# Patient Record
Sex: Male | Born: 1970 | Race: Black or African American | Hispanic: No | Marital: Single | Smoking: Never smoker
Health system: Southern US, Community
[De-identification: ages and names within clinical notes are randomized; demographics above are authoritative.]

## PROBLEM LIST (undated history)

## (undated) DIAGNOSIS — E119 Type 2 diabetes mellitus without complications: Secondary | ICD-10-CM

## (undated) DIAGNOSIS — E669 Obesity, unspecified: Secondary | ICD-10-CM

## (undated) DIAGNOSIS — I10 Essential (primary) hypertension: Secondary | ICD-10-CM

## (undated) DIAGNOSIS — E785 Hyperlipidemia, unspecified: Secondary | ICD-10-CM

## (undated) HISTORY — DX: Obesity, unspecified: E66.9

## (undated) HISTORY — DX: Hyperlipidemia, unspecified: E78.5

## (undated) HISTORY — DX: Type 2 diabetes mellitus without complications: E11.9

## (undated) HISTORY — DX: Essential (primary) hypertension: I10

---

## 1998-12-12 HISTORY — PX: CHOLECYSTECTOMY: SHX55

## 1999-09-21 ENCOUNTER — Emergency Department (HOSPITAL_COMMUNITY): Admission: EM | Admit: 1999-09-21 | Discharge: 1999-09-21 | Payer: Self-pay | Admitting: *Deleted

## 1999-09-21 ENCOUNTER — Encounter: Payer: Self-pay | Admitting: *Deleted

## 1999-10-10 ENCOUNTER — Emergency Department (HOSPITAL_COMMUNITY): Admission: EM | Admit: 1999-10-10 | Discharge: 1999-10-11 | Payer: Self-pay | Admitting: *Deleted

## 1999-10-19 ENCOUNTER — Observation Stay (HOSPITAL_COMMUNITY): Admission: RE | Admit: 1999-10-19 | Discharge: 1999-10-20 | Payer: Self-pay | Admitting: General Surgery

## 1999-10-19 ENCOUNTER — Encounter: Payer: Self-pay | Admitting: General Surgery

## 1999-10-19 ENCOUNTER — Encounter (INDEPENDENT_AMBULATORY_CARE_PROVIDER_SITE_OTHER): Payer: Self-pay

## 2001-07-30 ENCOUNTER — Emergency Department (HOSPITAL_COMMUNITY): Admission: EM | Admit: 2001-07-30 | Discharge: 2001-07-30 | Payer: Self-pay | Admitting: Podiatry

## 2002-02-09 ENCOUNTER — Emergency Department (HOSPITAL_COMMUNITY): Admission: EM | Admit: 2002-02-09 | Discharge: 2002-02-09 | Payer: Self-pay | Admitting: Emergency Medicine

## 2003-07-31 ENCOUNTER — Encounter: Payer: Self-pay | Admitting: Emergency Medicine

## 2003-07-31 ENCOUNTER — Emergency Department (HOSPITAL_COMMUNITY): Admission: EM | Admit: 2003-07-31 | Discharge: 2003-07-31 | Payer: Self-pay | Admitting: Emergency Medicine

## 2004-12-11 ENCOUNTER — Emergency Department (HOSPITAL_COMMUNITY): Admission: EM | Admit: 2004-12-11 | Discharge: 2004-12-11 | Payer: Self-pay | Admitting: Emergency Medicine

## 2005-01-26 ENCOUNTER — Ambulatory Visit: Payer: Self-pay | Admitting: Internal Medicine

## 2005-02-01 ENCOUNTER — Ambulatory Visit: Payer: Self-pay | Admitting: Internal Medicine

## 2005-02-02 ENCOUNTER — Ambulatory Visit: Payer: Self-pay | Admitting: *Deleted

## 2005-02-18 ENCOUNTER — Ambulatory Visit: Payer: Self-pay | Admitting: Internal Medicine

## 2005-06-03 ENCOUNTER — Ambulatory Visit: Payer: Self-pay | Admitting: Internal Medicine

## 2006-02-08 ENCOUNTER — Ambulatory Visit: Payer: Self-pay | Admitting: Internal Medicine

## 2006-08-03 ENCOUNTER — Ambulatory Visit: Payer: Self-pay | Admitting: Internal Medicine

## 2006-11-13 ENCOUNTER — Ambulatory Visit: Payer: Self-pay | Admitting: Internal Medicine

## 2006-11-14 ENCOUNTER — Encounter (INDEPENDENT_AMBULATORY_CARE_PROVIDER_SITE_OTHER): Payer: Self-pay | Admitting: Internal Medicine

## 2006-11-14 LAB — CONVERTED CEMR LAB
Hgb A1c MFr Bld: 8.7 %
TSH: 2.8 microintl units/mL

## 2006-11-30 ENCOUNTER — Ambulatory Visit: Payer: Self-pay | Admitting: Internal Medicine

## 2006-12-11 ENCOUNTER — Ambulatory Visit: Payer: Self-pay | Admitting: Internal Medicine

## 2007-03-02 ENCOUNTER — Ambulatory Visit: Payer: Self-pay | Admitting: Internal Medicine

## 2007-08-29 ENCOUNTER — Encounter (INDEPENDENT_AMBULATORY_CARE_PROVIDER_SITE_OTHER): Payer: Self-pay | Admitting: *Deleted

## 2007-09-04 ENCOUNTER — Encounter (INDEPENDENT_AMBULATORY_CARE_PROVIDER_SITE_OTHER): Payer: Self-pay | Admitting: Internal Medicine

## 2007-09-04 DIAGNOSIS — R809 Proteinuria, unspecified: Secondary | ICD-10-CM | POA: Insufficient documentation

## 2007-09-04 DIAGNOSIS — M25519 Pain in unspecified shoulder: Secondary | ICD-10-CM | POA: Insufficient documentation

## 2007-09-04 DIAGNOSIS — E1165 Type 2 diabetes mellitus with hyperglycemia: Secondary | ICD-10-CM | POA: Insufficient documentation

## 2007-09-04 DIAGNOSIS — E118 Type 2 diabetes mellitus with unspecified complications: Secondary | ICD-10-CM | POA: Insufficient documentation

## 2007-09-04 DIAGNOSIS — E119 Type 2 diabetes mellitus without complications: Secondary | ICD-10-CM

## 2008-02-21 ENCOUNTER — Ambulatory Visit: Payer: Self-pay | Admitting: Nurse Practitioner

## 2008-02-21 DIAGNOSIS — E785 Hyperlipidemia, unspecified: Secondary | ICD-10-CM

## 2008-02-21 DIAGNOSIS — I10 Essential (primary) hypertension: Secondary | ICD-10-CM

## 2008-02-21 LAB — CONVERTED CEMR LAB
Blood Glucose, Fingerstick: 150
Hgb A1c MFr Bld: 8 %

## 2008-02-26 LAB — CONVERTED CEMR LAB
ALT: 40 units/L (ref 0–53)
AST: 27 units/L (ref 0–37)
Albumin: 5.1 g/dL (ref 3.5–5.2)
Alkaline Phosphatase: 49 units/L (ref 39–117)
BUN: 12 mg/dL (ref 6–23)
CO2: 20 meq/L (ref 19–32)
Calcium: 10.1 mg/dL (ref 8.4–10.5)
Chloride: 101 meq/L (ref 96–112)
Cholesterol: 152 mg/dL (ref 0–200)
Creatinine, Ser: 0.92 mg/dL (ref 0.40–1.50)
Glucose, Bld: 157 mg/dL — ABNORMAL HIGH (ref 70–99)
HDL: 23 mg/dL — ABNORMAL LOW (ref >39)
LDL Cholesterol: 73 mg/dL (ref 0–99)
Microalb, Ur: 12.1 mg/dL — ABNORMAL HIGH (ref 0.00–1.89)
Potassium: 4.3 meq/L (ref 3.5–5.3)
Sodium: 138 meq/L (ref 135–145)
TSH: 1.502 microintl units/mL (ref 0.350–5.50)
Total Bilirubin: 0.5 mg/dL (ref 0.3–1.2)
Total CHOL/HDL Ratio: 6.6
Total Protein: 8.4 g/dL — ABNORMAL HIGH (ref 6.0–8.3)
Triglycerides: 282 mg/dL — ABNORMAL HIGH (ref <150)
VLDL: 56 mg/dL — ABNORMAL HIGH (ref 0–40)

## 2008-12-18 ENCOUNTER — Encounter (INDEPENDENT_AMBULATORY_CARE_PROVIDER_SITE_OTHER): Payer: Self-pay | Admitting: Nurse Practitioner

## 2008-12-25 ENCOUNTER — Telehealth (INDEPENDENT_AMBULATORY_CARE_PROVIDER_SITE_OTHER): Payer: Self-pay | Admitting: Nurse Practitioner

## 2009-01-07 ENCOUNTER — Ambulatory Visit: Payer: Self-pay | Admitting: Nurse Practitioner

## 2009-01-07 LAB — CONVERTED CEMR LAB
ALT: 39 units/L (ref 0–53)
AST: 20 units/L (ref 0–37)
Albumin: 5 g/dL (ref 3.5–5.2)
Alkaline Phosphatase: 53 units/L (ref 39–117)
BUN: 14 mg/dL (ref 6–23)
Basophils Absolute: 0 10*3/uL (ref 0.0–0.1)
Basophils Relative: 0 % (ref 0–1)
Bilirubin Urine: NEGATIVE
Blood Glucose, Fingerstick: 128
CO2: 24 meq/L (ref 19–32)
Calcium: 10.3 mg/dL (ref 8.4–10.5)
Chloride: 102 meq/L (ref 96–112)
Cholesterol, target level: 200 mg/dL
Creatinine, Ser: 0.87 mg/dL (ref 0.40–1.50)
Eosinophils Absolute: 0.2 10*3/uL (ref 0.0–0.7)
Eosinophils Relative: 2 % (ref 0–5)
Glucose, Bld: 113 mg/dL — ABNORMAL HIGH (ref 70–99)
Glucose, Urine, Semiquant: NEGATIVE
HCT: 44.1 % (ref 39.0–52.0)
HDL goal, serum: 40 mg/dL
Hemoglobin: 15.3 g/dL (ref 13.0–17.0)
Hgb A1c MFr Bld: 9.5 %
LDL Goal: 100 mg/dL
Lymphocytes Relative: 59 % — ABNORMAL HIGH (ref 12–46)
Lymphs Abs: 5.1 10*3/uL — ABNORMAL HIGH (ref 0.7–4.0)
MCHC: 34.7 g/dL (ref 30.0–36.0)
MCV: 86.8 fL (ref 78.0–100.0)
Microalb, Ur: 6.99 mg/dL — ABNORMAL HIGH (ref 0.00–1.89)
Monocytes Absolute: 0.4 10*3/uL (ref 0.1–1.0)
Monocytes Relative: 5 % (ref 3–12)
Neutro Abs: 2.9 10*3/uL (ref 1.7–7.7)
Neutrophils Relative %: 33 % — ABNORMAL LOW (ref 43–77)
Nitrite: NEGATIVE
Platelets: 301 10*3/uL (ref 150–400)
Potassium: 4.8 meq/L (ref 3.5–5.3)
Protein, U semiquant: 30
RBC: 5.08 M/uL (ref 4.22–5.81)
RDW: 13 % (ref 11.5–15.5)
Sodium: 140 meq/L (ref 135–145)
Specific Gravity, Urine: >=1.03
Total Bilirubin: 0.3 mg/dL (ref 0.3–1.2)
Total Protein: 8.3 g/dL (ref 6.0–8.3)
Urobilinogen, UA: 1
WBC Urine, dipstick: NEGATIVE
WBC: 8.6 10*3/uL (ref 4.0–10.5)
pH: 5

## 2009-01-08 ENCOUNTER — Encounter (INDEPENDENT_AMBULATORY_CARE_PROVIDER_SITE_OTHER): Payer: Self-pay | Admitting: Nurse Practitioner

## 2009-02-10 ENCOUNTER — Ambulatory Visit: Payer: Self-pay | Admitting: Nurse Practitioner

## 2009-05-13 ENCOUNTER — Ambulatory Visit: Payer: Self-pay | Admitting: Nurse Practitioner

## 2009-05-13 DIAGNOSIS — M79609 Pain in unspecified limb: Secondary | ICD-10-CM | POA: Insufficient documentation

## 2009-05-13 DIAGNOSIS — K089 Disorder of teeth and supporting structures, unspecified: Secondary | ICD-10-CM

## 2009-05-13 LAB — CONVERTED CEMR LAB
Blood Glucose, AC Bkfst: 78 mg/dL
Hgb A1c MFr Bld: 8.8 %

## 2009-05-15 ENCOUNTER — Encounter (INDEPENDENT_AMBULATORY_CARE_PROVIDER_SITE_OTHER): Payer: Self-pay | Admitting: Nurse Practitioner

## 2009-05-15 LAB — CONVERTED CEMR LAB
AST: 19 units/L (ref 0–37)
Albumin: 5 g/dL (ref 3.5–5.2)
Bilirubin, Direct: 0.1 mg/dL (ref 0.0–0.3)
LDL Cholesterol: 73 mg/dL (ref 0–99)
Total Bilirubin: 0.3 mg/dL (ref 0.3–1.2)
Total CHOL/HDL Ratio: 7.4

## 2009-05-18 ENCOUNTER — Encounter (INDEPENDENT_AMBULATORY_CARE_PROVIDER_SITE_OTHER): Payer: Self-pay | Admitting: Nurse Practitioner

## 2009-05-19 ENCOUNTER — Telehealth (INDEPENDENT_AMBULATORY_CARE_PROVIDER_SITE_OTHER): Payer: Self-pay | Admitting: Nurse Practitioner

## 2009-05-22 ENCOUNTER — Encounter (INDEPENDENT_AMBULATORY_CARE_PROVIDER_SITE_OTHER): Payer: Self-pay | Admitting: Nurse Practitioner

## 2009-11-20 ENCOUNTER — Telehealth: Payer: Self-pay | Admitting: Physician Assistant

## 2010-07-30 ENCOUNTER — Ambulatory Visit: Payer: Self-pay | Admitting: Nurse Practitioner

## 2010-07-30 DIAGNOSIS — E669 Obesity, unspecified: Secondary | ICD-10-CM | POA: Insufficient documentation

## 2010-07-30 LAB — CONVERTED CEMR LAB
Blood Glucose, Fingerstick: 155
Glucose, Urine, Semiquant: NEGATIVE
Hgb A1c MFr Bld: 9.1 %
Specific Gravity, Urine: >=1.03
pH: 5

## 2010-08-01 LAB — CONVERTED CEMR LAB
AST: 24 units/L (ref 0–37)
Albumin: 5.3 g/dL — ABNORMAL HIGH (ref 3.5–5.2)
Alkaline Phosphatase: 35 units/L — ABNORMAL LOW (ref 39–117)
BUN: 18 mg/dL (ref 6–23)
Calcium: 11.1 mg/dL — ABNORMAL HIGH (ref 8.4–10.5)
Chloride: 101 meq/L (ref 96–112)
Eosinophils Absolute: 0.2 10*3/uL (ref 0.0–0.7)
Glucose, Bld: 154 mg/dL — ABNORMAL HIGH (ref 70–99)
HDL: 25 mg/dL — ABNORMAL LOW (ref >39)
LDL Cholesterol: 117 mg/dL — ABNORMAL HIGH (ref 0–99)
Lymphs Abs: 3.6 10*3/uL (ref 0.7–4.0)
Microalb, Ur: 3.92 mg/dL — ABNORMAL HIGH (ref 0.00–1.89)
Monocytes Relative: 4 % (ref 3–12)
Neutro Abs: 5.2 10*3/uL (ref 1.7–7.7)
Neutrophils Relative %: 56 % (ref 43–77)
Platelets: 312 10*3/uL (ref 150–400)
Potassium: 4.5 meq/L (ref 3.5–5.3)
RBC: 4.76 M/uL (ref 4.22–5.81)
Sodium: 138 meq/L (ref 135–145)
TSH: 1.512 microintl units/mL (ref 0.350–4.500)
Total Protein: 8.5 g/dL — ABNORMAL HIGH (ref 6.0–8.3)
Triglycerides: 207 mg/dL — ABNORMAL HIGH (ref <150)
WBC: 9.4 10*3/uL (ref 4.0–10.5)

## 2010-08-04 ENCOUNTER — Encounter (INDEPENDENT_AMBULATORY_CARE_PROVIDER_SITE_OTHER): Payer: Self-pay | Admitting: *Deleted

## 2010-08-26 ENCOUNTER — Ambulatory Visit: Payer: Self-pay | Admitting: Nurse Practitioner

## 2010-08-27 LAB — CONVERTED CEMR LAB
Calcium, Total (PTH): 10.1 mg/dL (ref 8.4–10.5)
PTH: 23.5 pg/mL (ref 14.0–72.0)

## 2011-01-11 NOTE — Letter (Signed)
Summary: *HSN Results Follow up  [facility]  [address] [place], [place] [address]   Phone: [phone]  Fax: [phone]      08/04/2010   [facility] [address] [place], [place]  [address]   Dear  Mr. Patrick [name],                            ____S.Drinkard,FNP   ____D. Gore,FNP       ____B. McPherson,MD   ____V. Rankins,MD    ____E. Mulberry,MD    ____N. Daphine Deutscher, FNP  ____D. Reche Dixon, MD    ____K. Philipp Deputy, MD    ____Other     This letter is to inform you that your recent test(s):  _______Pap Smear    _______Lab Test     _______X-ray    _______ is within acceptable limits  _______ requires a medication change  _______ requires a follow-up lab visit  _______ requires a follow-up visit with your Patrick [name]   Comments: We have been trying to contact you at [phone].  Please contact the office at your earliest convenience.      _________________________________________________________ If you have any questions, please contact our office                     Sincerely,  Patrick [name] [facility]

## 2011-01-11 NOTE — Assessment & Plan Note (Signed)
Summary: Diabetes   Vital Signs:  Patient profile:   40 year old male Weight:      245.8 pounds BMI:     37.23 Temp:     97.8 degrees F oral Pulse rate:   103 / minute Pulse rhythm:   regular Resp:     20 per minute BP sitting:   116 / 80  (left arm) Cuff size:   large  Vitals Entered By: Levon Hedger (July 30, 2010 2:51 PM)  Nutrition Counseling: Patient's BMI is greater than 25 and therefore counseled on weight management options. CC: last seen 2010 HTN, DM, Hypertension Management, Lipid Management Is Patient Diabetic? Yes Pain Assessment Patient in pain? no      CBG Result 155 CBG Device ID B  Does patient need assistance? Functional Status Self care Ambulation Normal   CC:  last seen 2010 HTN, DM, Hypertension Management, and Lipid Management.  History of Present Illness:  Pt into the office for diabetes.  Social - 44 year old son recovering from cancer (pt unsure of type)  He has been at [facility] and has finished chemotherapy.  Diabetes Management History:      The patient is a 40 years old male who comes in for evaluation of Type 2 Diabetes Mellitus.  He has not been enrolled in the "Diabetic Education Program".  He states understanding of dietary principles but he is not following the appropriate diet.  No sensory loss is reported.  Self foot exams are not being performed.  He is not checking home blood sugars.  He says that he is not exercising regularly.        Hypoglycemic symptoms are not occurring.  No hyperglycemic symptoms are reported.  Other comments include: needs a glucometer .    Hypertension History:      He denies headache, chest pain, and palpitations.  He notes no problems with any antihypertensive medication side effects.        Positive major cardiovascular risk factors include diabetes, hyperlipidemia, and hypertension.  Negative major cardiovascular risk factors include male age less than 52 years old, negative family history for  ischemic heart disease, and non-tobacco-user status.        Further assessment for target organ damage reveals no history of ASHD, cardiac end-organ damage (CHF/LVH), stroke/TIA, peripheral vascular disease, renal insufficiency, or hypertensive retinopathy.    Lipid Management History:      Positive NCEP/ATP III risk factors include diabetes, HDL cholesterol less than 40, and hypertension.  Negative NCEP/ATP III risk factors include male age less than 44 years old, no family history for ischemic heart disease, non-tobacco-user status, no ASHD (atherosclerotic heart disease), no prior stroke/TIA, no peripheral vascular disease, and no history of aortic aneurysm.        The patient states that he knows about the "Therapeutic Lifestyle Change" diet.  The patient does not know about adjunctive measures for cholesterol lowering.  He notes side effects from his lipid-lowering medication.  Comments include: pt stopped taking lovaza .  The patient denies any symptoms to suggest myopathy or liver disease.     Current Medications (verified): 1)  Glucotrol 5 Mg  Tabs (Glipizide) .Marland Kitchen.. 1 Tab By Mouth Daily in Morning and  1 Tab By Mouth Every Evening 2)  Naprosyn 500 Mg  Tabs (Naproxen) .Marland Kitchen.. 1 Tablet By Mouth Two Times A Day For Inflammation 3)  Aspirin Ec Lo-Dose 81 Mg  Tbec (Aspirin) .Marland Kitchen.. 1 Tablet By Mouth Daily 4)  Tricor  145 Mg  Tabs (Fenofibrate) .Marland Kitchen.. 1 Tablet By Mouth Daily For Cholesterol 5)  Zestoretic 10-12.5 Mg  Tabs (Lisinopril-Hydrochlorothiazide) .Marland Kitchen.. 1 Tablet By Mouth Daily For Blood Pressure 6)  Janumet 50-1000 Mg Tabs (Sitagliptin-Metformin Hcl) .Marland Kitchen.. 1 Tablet By Mouth Two Times A Day For Blood Sugar 7)  Meloxicam 15 Mg Tabs (Meloxicam) .... One Tablet By Mouth Daily For Foot 8)  Lovaza 1 Gm Caps (Omega-3-Acid Ethyl Esters) .... 2 Capsules By Mouth Two Times A Day  Allergies (verified): No Known Drug Allergies  Review of Systems General:  Denies fever. CV:  Denies chest pain or  discomfort. Resp:  Denies cough. GI:  Denies abdominal pain, nausea, and vomiting.  Physical Exam  General:  alert.   Head:  normocephalic.   Lungs:  normal breath sounds.   Heart:  normal rate and regular rhythm.   Abdomen:  normal bowel sounds.   Msk:  normal ROM.   Neurologic:  alert & oriented X3.   Skin:  color normal.   Psych:  Oriented X3.     Impression & Recommendations:  Problem # 1:  DIABETES MELLITUS, TYPE II (ICD-250.00) Rx for glucometer given to pt advised pt that he needs better control of blood sugar will increase glucotrol to 10mg  by mouth two times a day and continue other meds -pt admits he has not been eating and exercising properly foot exam done His updated medication list for this problem includes:    Glucotrol 10 Mg Tabs (Glipizide) ..... One tablet by mouth two times a day for blood sugar **note change in dose**    Aspirin Ec Lo-dose 81 Mg Tbec (Aspirin) .Marland Kitchen... 1 tablet by mouth daily    Zestoretic 10-12.5 Mg Tabs (Lisinopril-hydrochlorothiazide) .Marland Kitchen... 1 tablet by mouth daily for blood pressure    Janumet 50-1000 Mg Tabs (Sitagliptin-metformin hcl) .Marland Kitchen... 1 tablet by mouth two times a day for blood sugar  Orders: Capillary Blood Glucose/CBG (82948) Hemoglobin A1C (83036) UA Dipstick w/o Micro (manual) (04540) T-Urine Microalbumin w/creat. ratio [phone]) T-PSA (13086-57846) T-CBC w/Diff (96295-28413) Rapid HIV  (244[address]) T-TSH (02725-36644)  Problem # 2:  HYPERTENSION, BENIGN ESSENTIAL (ICD-401.1) BP controlled reviewed DASH diet His updated medication list for this problem includes:    Zestoretic 10-12.5 Mg Tabs (Lisinopril-hydrochlorothiazide) .Marland Kitchen... 1 tablet by mouth daily for blood pressure  Problem # 3:  DYSLIPIDEMIA (ICD-272.4) will check labs today pt is no longer taking lovaza His updated medication list for this problem includes:    Tricor 145 Mg Tabs (Fenofibrate) .Marland Kitchen... 1 tablet by mouth daily for cholesterol    Lovaza 1 Gm  Caps (Omega-3-acid ethyl esters) ..... Not taking  Orders: T-Lipid Profile [phone]) T-Comprehensive Metabolic Panel [phone])  Problem # 4:  OBESITY (ICD-278.00) spoke with pt about diet and exercise to decrease weight  Complete Medication List: 1)  Glucotrol 10 Mg Tabs (Glipizide) .... One tablet by mouth two times a day for blood sugar **note change in dose** 2)  Aspirin Ec Lo-dose 81 Mg Tbec (Aspirin) .Marland Kitchen.. 1 tablet by mouth daily 3)  Tricor 145 Mg Tabs (Fenofibrate) .Marland Kitchen.. 1 tablet by mouth daily for cholesterol 4)  Zestoretic 10-12.5 Mg Tabs (Lisinopril-hydrochlorothiazide) .Marland Kitchen.. 1 tablet by mouth daily for blood pressure 5)  Janumet 50-1000 Mg Tabs (Sitagliptin-metformin hcl) .Marland Kitchen.. 1 tablet by mouth two times a day for blood sugar 6)  Lovaza 1 Gm Caps (Omega-3-acid ethyl esters) .... Not taking 7)  Blood Glucose Meter Kit (Blood glucose monitoring suppl) .... Use to check blood sugar daily before  meals 8)  Lancet Device Misc (Lancet devices) .... Use to check blood sugar daily before meals 9)  Blood Glucose Test Strp (Glucose blood) .... Use to check blood sugar before breakfast  Diabetes Management Assessment/Plan:      The following lipid goals have been established for the patient: Total cholesterol goal of 200; LDL cholesterol goal of 100; HDL cholesterol goal of 40; Triglyceride goal of 150.  His blood pressure goal is < 130/80.    Hypertension Assessment/Plan:      The patient's hypertensive risk group is category C: Target organ damage and/or diabetes.  His calculated 10 year risk of coronary heart disease is 6 %.  Today's blood pressure is 116/80.  His blood pressure goal is < 130/80.  Lipid Assessment/Plan:      Based on NCEP/ATP III, the patient's risk factor category is "history of diabetes".  The patient's lipid goals are as follows: Total cholesterol goal is 200; LDL cholesterol goal is 100; HDL cholesterol goal is 40; Triglyceride goal is 150.      Patient  Instructions: 1)  Your diabetes is still uncontrolled. Hgba1c = 9.1   2)  The goal is less than 7 3)  You need to change your diet. 4)  Glucotrol will be increased to 10mg  by mouth daily and Janumet by mouth two times a day. 5)  Your labs will be checked today and you will be notified of the results. 6)  Follow up in this ofifce in 3 months for diabetes. 7)  Will check Hgba1c and flu vaccine will be here Prescriptions: BLOOD GLUCOSE TEST  STRP (GLUCOSE BLOOD) use to check blood sugar before breakfast  #100 x 5   Entered and Authorized by:   Lehman Prom FNP   Signed by:   Lehman Prom FNP on 07/30/2010   Method used:   Faxed to ...       [facility] (retail)       [address] [place], [place]  [address]       Ph: [phone]       Fax: [phone]   RxID:   [id number] LANCET DEVICE  MISC (LANCET DEVICES) Use to check blood sugar daily before meals  #100 x 5   Entered and Authorized by:   Lehman Prom FNP   Signed by:   Lehman Prom FNP on 07/30/2010   Method used:   Faxed to ...       [facility] (retail)       [address] [place], [place]  [address]       Ph: [phone]       Fax: [phone]   RxID:   [id number] BLOOD GLUCOSE METER  KIT (BLOOD GLUCOSE MONITORING SUPPL) Use to check blood sugar daily before meals  #1 x 0   Entered and Authorized by:   Lehman Prom FNP   Signed by:   Lehman Prom FNP on 07/30/2010   Method used:   Faxed to ...       [facility] (retail)       [address] [place], [place]  [address]       Ph: [phone]       Fax: [phone]   RxID:   [id number] ZESTORETIC 10-12.5 MG  TABS (LISINOPRIL-HYDROCHLOROTHIAZIDE) 1 tablet by mouth daily for blood  pressure  #30 x 5   Entered and Authorized by:   Lehman Prom FNP   Signed by:   Lehman Prom FNP on 07/30/2010    Method used:   Faxed to ...       [facility] - [facility] (retail)       [address] [place], [place]  [address]       Ph: [phone]       Fax: [phone]   RxID:   [id number] JANUMET 50-1000 MG TABS (SITAGLIPTIN-METFORMIN HCL) 1 tablet by mouth two times a day for blood sugar  #60 x 5   Entered and Authorized by:   Lehman Prom FNP   Signed by:   Lehman Prom FNP on 07/30/2010   Method used:   Faxed to ...       [facility] (retail)       [address] [place], [place]  [address]       Ph: [phone]       Fax: [phone]   RxID:   [id number] TRICOR 145 MG  TABS (FENOFIBRATE) 1 tablet by mouth daily for cholesterol  #30 x 5   Entered and Authorized by:   Lehman Prom FNP   Signed by:   Lehman Prom FNP on 07/30/2010   Method used:   Faxed to ...       [facility] - [facility] (retail)       [address] [place], [place]  [address]       Ph: [phone]       Fax: [phone]   RxID:   [id number] GLUCOTROL 10 MG TABS (GLIPIZIDE) One tablet by mouth two times a day for blood sugar **note change in dose**  #60 x 5   Entered and Authorized by:   Lehman Prom FNP   Signed by:   Lehman Prom FNP on 07/30/2010   Method used:   Faxed to ...       [facility] - [facility] (retail)       [address] [place], [place]  [address]       Ph: [phone]       Fax: [phone]   RxID:   [id number]   Laboratory Results   Urine Tests  Date/Time Received: July 30, 2010 3:07 PM   Routine Urinalysis   Color: brown Appearance: Clear Glucose: negative   (Normal Range: Negative) Bilirubin: moderate   (Normal Range: Negative) Ketone: trace (5)   (Normal Range: Negative) Spec. Gravity: >=1.030   (Normal Range: 1.003-1.035) Blood: negative   (Normal Range: Negative) pH: 5.0   (Normal  Range: 5.0-8.0) Protein: trace   (Normal Range: Negative) Urobilinogen: 0.2   (Normal Range: 0-1) Nitrite: negative   (Normal Range: Negative) Leukocyte Esterace: negative   (Normal Range: Negative)     Blood Tests   Date/Time Received: July 30, 2010 3:07 PM   HGBA1C: 9.1%   (Normal Range: Non-Diabetic - 3-6%   Control Diabetic - 6-8%) CBG Random:: 155      Prevention & Chronic Care Immunizations   Influenza vaccine: none available in office - advised to get from local pharmacy  (01/07/2009)    Tetanus booster: 01/18/2005: Historical    Pneumococcal vaccine: Historical  (02/15/2005)  Other Screening   Smoking status: never  (05/13/2009)  Diabetes Mellitus   HgbA1C: 9.1  (07/30/2010)   HgbA1C action/deferral: Ordered  (07/30/2010)   Hemoglobin A1C due: 10/30/2010    Eye exam: Not documented    Foot exam: yes  (05/13/2009)   Foot exam action/deferral: Do today   High risk foot: Not documented   Foot care education: Not documented    Urine microalbumin/creatinine ratio: Not documented   Urine microalbumin action/deferral: Ordered   Urine microalbumin/cr due: 07/31/2011  Lipids   Total Cholesterol: 163  (05/13/2009)   Lipid panel action/deferral: Lipid Panel ordered   LDL: 73  (05/13/2009)   LDL Direct: Not documented   HDL: 22  (05/13/2009)   Triglycerides: 340  (05/13/2009)    SGOT (AST): 19  (05/13/2009)   BMP action: Ordered   SGPT (ALT): 30  (05/13/2009) CMP ordered    Alkaline phosphatase: 30  (05/13/2009)   Total bilirubin: 0.3  (05/13/2009)  Hypertension   Last Blood Pressure: 116 / 80  (07/30/2010)   Serum creatinine: 0.87  (01/07/2009)   Serum potassium 4.8  (01/07/2009) CMP ordered   Self-Management Support :    Diabetes self-management support: Not documented    Hypertension self-management support: Not documented    Lipid self-management support: Not documented    Appended Document: Diabetes     Allergies: No Known Drug  Allergies   Complete Medication List: 1)  Glucotrol 10 Mg Tabs (Glipizide) .... One tablet by mouth two times a day for blood sugar **note change in dose** 2)  Aspirin Ec Lo-dose 81 Mg Tbec (Aspirin) .Marland Kitchen.. 1 tablet by mouth daily 3)  Tricor 145 Mg Tabs (Fenofibrate) .Marland Kitchen.. 1 tablet by mouth daily for cholesterol 4)  Zestoretic 10-12.5 Mg Tabs (Lisinopril-hydrochlorothiazide) .Marland Kitchen.. 1 tablet by mouth daily for blood pressure 5)  Janumet 50-1000 Mg Tabs (Sitagliptin-metformin hcl) .Marland Kitchen.. 1 tablet by mouth two times a day for blood sugar 6)  Lovaza 1 Gm Caps (Omega-3-acid ethyl esters) .... Not taking 7)  Blood Glucose Meter Kit (Blood glucose monitoring suppl) .... Use to check blood sugar daily before meals 8)  Lancet Device Misc (Lancet devices) .... Use to check blood sugar daily before meals 9)  Blood Glucose Test Strp (Glucose blood) .... Use to check blood sugar before breakfast 10)  Zetia 10 Mg Tabs (Ezetimibe) .... One tablet by mouth daily for cholesterol   Laboratory Results  Date/Time Received: August 02, 2010 9:39 AM   Other Tests  Rapid HIV: negative   Appended Document: Diabetes     Allergies: No Known Drug Allergies  Diabetes Management Exam:    Foot Exam (with socks and/or shoes not present):       Sensory-Monofilament:          Left foot: normal          Right foot: normal   Complete Medication List: 1)  Glucotrol 10 Mg Tabs (Glipizide) .... One tablet by mouth two times a day for blood sugar **note change in dose** 2)  Aspirin Ec Lo-dose 81 Mg Tbec (Aspirin) .Marland Kitchen.. 1 tablet by mouth daily 3)  Tricor 145 Mg Tabs (Fenofibrate) .Marland Kitchen.. 1 tablet by mouth daily for cholesterol 4)  Zestoretic 10-12.5 Mg Tabs (Lisinopril-hydrochlorothiazide) .Marland Kitchen.. 1 tablet by mouth daily for blood pressure 5)  Janumet 50-1000 Mg Tabs (Sitagliptin-metformin hcl) .Marland Kitchen.. 1 tablet by mouth two times a day for blood sugar 6)  Lovaza 1 Gm Caps (Omega-3-acid ethyl esters) .... Not taking 7)  Blood  Glucose Meter Kit (Blood glucose monitoring suppl) .... Use to  check blood sugar daily before meals 8)  Lancet Device Misc (Lancet devices) .... Use to check blood sugar daily before meals 9)  Blood Glucose Test Strp (Glucose blood) .... Use to check blood sugar before breakfast 10)  Zetia 10 Mg Tabs (Ezetimibe) .... One tablet by mouth daily for cholesterol   Last LDL:                                                 117 (07/30/2010 10:56:00 PM)        Diabetic Foot Exam    10-g (5.07) Semmes-Weinstein Monofilament Test Performed by: Levon Hedger          Right Foot          Left Foot Visual Inspection               Test Control      normal         normal Site 1         normal         normal Site 2         normal         normal Site 3         normal         normal Site 4         normal         normal Site 5         normal         normal Site 6         normal         normal Site 7         normal         normal Site 8         normal         normal Site 9         normal         normal Site 10         normal         normal  Impression      normal         normal

## 2011-01-25 ENCOUNTER — Emergency Department (HOSPITAL_COMMUNITY)
Admission: EM | Admit: 2011-01-25 | Discharge: 2011-01-25 | Disposition: A | Discharge status: Home / Self Care | Payer: Self-pay | Attending: Emergency Medicine | Admitting: Emergency Medicine

## 2011-01-25 DIAGNOSIS — M79609 Pain in unspecified limb: Secondary | ICD-10-CM | POA: Insufficient documentation

## 2012-11-19 ENCOUNTER — Ambulatory Visit (INDEPENDENT_AMBULATORY_CARE_PROVIDER_SITE_OTHER): Payer: Self-pay | Admitting: Family Medicine

## 2012-11-19 ENCOUNTER — Encounter: Payer: Self-pay | Admitting: Family Medicine

## 2012-11-19 VITALS — BP 146/103 | HR 91 | Ht 69.0 in | Wt 241.0 lb

## 2012-11-19 DIAGNOSIS — I10 Essential (primary) hypertension: Secondary | ICD-10-CM

## 2012-11-19 DIAGNOSIS — E119 Type 2 diabetes mellitus without complications: Secondary | ICD-10-CM

## 2012-11-19 DIAGNOSIS — E785 Hyperlipidemia, unspecified: Secondary | ICD-10-CM

## 2012-11-19 MED ORDER — GLIPIZIDE 10 MG PO TABS: 10.0000 mg | ORAL_TABLET | Freq: Two times a day (BID) | ORAL | Status: DC

## 2012-11-19 MED ORDER — METFORMIN HCL 500 MG PO TABS: 500.0000 mg | ORAL_TABLET | Freq: Two times a day (BID) | ORAL | Status: DC

## 2012-11-19 MED ORDER — FENOFIBRATE 145 MG PO TABS: 145.0000 mg | ORAL_TABLET | Freq: Every day | ORAL | Status: DC

## 2012-11-19 MED ORDER — LISINOPRIL-HYDROCHLOROTHIAZIDE 10-12.5 MG PO TABS: 1.0000 | ORAL_TABLET | Freq: Every day | ORAL | Status: DC

## 2012-11-19 MED ORDER — EZETIMIBE 10 MG PO TABS: 10.0000 mg | ORAL_TABLET | Freq: Every day | ORAL | Status: DC

## 2012-11-19 NOTE — Progress Notes (Signed)
  Subjective:    Patient ID: Patrick [name], male    DOB: 1971/07/04, 41 y.o.   MRN: [id number]  HPI  Patrick [name] comes in to establish care.  He used to be seen at [facility], but no long has a doctor.  Also, he has run out of all of his medications.   HTN: Not taking HCTZ/Lisinopril.  Denies chest pain, dizziness, palpitations, LE edema.  Patient does not check blood pressures.   DM: Never taken insulin, last A1C was 8.8 in May 2013.  Not taking medications now, has not checked blood sugars because he did not want to see how high it was.  No hyper or hypoglycemic episodes, no polyuria or polydypisa  HLD: Has hypertrigleceridemia per prior records.  Not making lifestyle modifications, was taking zetia and tricor but has run out.    Past Medical History  Diagnosis Date  . Diabetes mellitus without complication   . Hyperlipidemia   . Hypertension    Family History  Problem Relation Age of Onset  . Cancer Mother   . Diabetes Mother   . Hypertension Mother   . Diabetes Sister   . Cancer Maternal Grandmother    History  Substance Use Topics  . Smoking status: Never Smoker   . Smokeless tobacco: Never Used  . Alcohol Use: Not on file   Review of Systems Pertinent items in HPI    Objective:   Physical Exam BP 146/103  Pulse 91  Ht 5\' 9"  (1.753 m)  Wt 241 lb (109.317 kg)  BMI 35.59 kg/m2 General appearance: alert, cooperative and no distress Lungs: clear to auscultation bilaterally Heart: regular rate and rhythm, S1, S2 normal, no murmur, click, rub or gallop Extremities: extremities normal, atraumatic, no cyanosis or edema Pulses: 2+ and symmetric       Assessment & Plan:

## 2012-11-19 NOTE — Assessment & Plan Note (Signed)
Likely not under control given off of medications.  He was taking janumet and glucotrol, but not able to afford janumet.  Will re-start metformin and glucotrol, plan to increase metformin next visit.

## 2012-11-19 NOTE — Assessment & Plan Note (Signed)
Slightly elevated but will now re-start lisinopril/HCTZ. Plan on labs after he gets the orange cards.

## 2012-11-19 NOTE — Patient Instructions (Signed)
It was nice to meet you.  I have sent all your prescriptions to the [facility].  Please take each medication daily.    Please make an appointment to see me again after you get the orange card, I want to check your cholesterol and other blood work.    Happy Holidays!

## 2012-11-19 NOTE — Assessment & Plan Note (Signed)
Re-start zetia and tricor, plan on getting lipids after he gets the orange card.

## 2013-05-28 ENCOUNTER — Other Ambulatory Visit: Payer: Self-pay | Admitting: Family Medicine

## 2013-07-24 ENCOUNTER — Telehealth: Payer: Self-pay | Admitting: *Deleted

## 2013-07-24 NOTE — Telephone Encounter (Signed)
Letter sent regarding diabetes follow up care Elizabeth Val Schiavo, RN-BSN   

## 2013-07-30 ENCOUNTER — Other Ambulatory Visit: Payer: Self-pay | Admitting: Family Medicine

## 2013-08-29 ENCOUNTER — Other Ambulatory Visit: Payer: Self-pay | Admitting: Family Medicine

## 2013-08-30 NOTE — Telephone Encounter (Signed)
Will refill again for 1 month, but as stated in August, pt needs MD appt.

## 2013-09-25 ENCOUNTER — Ambulatory Visit: Payer: Self-pay | Admitting: Family Medicine

## 2013-09-25 ENCOUNTER — Other Ambulatory Visit: Payer: Self-pay | Admitting: Family Medicine

## 2013-09-30 ENCOUNTER — Other Ambulatory Visit: Payer: Self-pay | Admitting: Family Medicine

## 2013-10-31 ENCOUNTER — Other Ambulatory Visit: Payer: Self-pay | Admitting: Family Medicine

## 2013-11-16 ENCOUNTER — Other Ambulatory Visit: Payer: Self-pay | Admitting: Family Medicine

## 2013-11-28 ENCOUNTER — Other Ambulatory Visit: Payer: Self-pay | Admitting: Family Medicine

## 2013-11-29 NOTE — Telephone Encounter (Signed)
Will fill x 1 mo. Please call and let patient know she needs f/u office visit. Thanks. Leona Singleton, MD

## 2013-11-29 NOTE — Telephone Encounter (Signed)
Pt is calling to set up orange card appt on Monday.  He will make appt to Dr. Karie Schwalbe after that. [place], Maryjo Rochester

## 2013-12-30 ENCOUNTER — Encounter: Payer: Self-pay | Admitting: Family Medicine

## 2013-12-30 ENCOUNTER — Ambulatory Visit (INDEPENDENT_AMBULATORY_CARE_PROVIDER_SITE_OTHER): Payer: No Typology Code available for payment source | Admitting: Family Medicine

## 2013-12-30 ENCOUNTER — Ambulatory Visit: Payer: No Typology Code available for payment source

## 2013-12-30 VITALS — BP 134/80 | HR 98 | Temp 98.6°F | Ht 68.25 in | Wt 242.0 lb

## 2013-12-30 DIAGNOSIS — E119 Type 2 diabetes mellitus without complications: Secondary | ICD-10-CM

## 2013-12-30 DIAGNOSIS — I10 Essential (primary) hypertension: Secondary | ICD-10-CM

## 2013-12-30 DIAGNOSIS — K089 Disorder of teeth and supporting structures, unspecified: Secondary | ICD-10-CM

## 2013-12-30 LAB — BASIC METABOLIC PANEL
BUN: 12 mg/dL (ref 6–23)
CHLORIDE: 98 meq/L (ref 96–112)
CO2: 27 meq/L (ref 19–32)
Calcium: 9.7 mg/dL (ref 8.4–10.5)
Creat: 0.92 mg/dL (ref 0.50–1.35)
Glucose, Bld: 326 mg/dL — ABNORMAL HIGH (ref 70–99)
Potassium: 4.4 mEq/L (ref 3.5–5.3)
Sodium: 136 mEq/L (ref 135–145)

## 2013-12-30 LAB — POCT GLYCOSYLATED HEMOGLOBIN (HGB A1C): HEMOGLOBIN A1C: 12

## 2013-12-30 MED ORDER — LISINOPRIL-HYDROCHLOROTHIAZIDE 10-12.5 MG PO TABS: ORAL_TABLET | ORAL | Status: DC

## 2013-12-30 MED ORDER — METFORMIN HCL 500 MG PO TABS: ORAL_TABLET | ORAL | Status: DC

## 2013-12-30 MED ORDER — GLIPIZIDE 10 MG PO TABS: ORAL_TABLET | ORAL | Status: DC

## 2013-12-30 NOTE — Patient Instructions (Addendum)
For your diabetes, your A1c is very high. Continue your current medicines for now but come back in 2-3 days for diabetic teaching. I want you to learn how to administer lantus insulin and how to check blood sugar fasting. After this teaching, we can order the insulin and diabetic testing supplies.   I would like you to follow up with a dentist.  We are checking a metabolic panel today.  I want to recheck cholesterol at our next visit. Try to make an appt with me in the AM when you have not eaten anything.  Follow up with me in 2-3 weeks so we can follow up on your diabetes.  In the meantime, check your blood sugar first thing in the mornings.   Patrick SingletonMaria T Colm Lyford, MD

## 2013-12-30 NOTE — Progress Notes (Signed)
Patient ID: Patrick [name], male   DOB: 08/28/1971, 43 y.o.   MRN: [id number] Subjective:   CC: Patient presents today to establish care. [facility]. Additionally, patient would like to discuss management of chronic issues.  HPI:   Chronic issues include diabetes, HTN, obesity, and HLD.   HLD: He stopped taking his cholesterol medications due to cost but takes other medicines.   HTN: He still takes his antihypertensives and does not report med side effects, blurred vision, headaches, chest pain, shortness of breath.  DM: He reports taking diabetes medications regularly and does not report urinary frequency or blurred vision. He has not checked blood sugar in some time but thinks he has testing supplies at home. Has never taken insulin. Last A1c in chart 9.1 07/2010, but previous office note from one visit with Dr Lula Olszewskihamberlain states it was 8.18 Apr 2012.   Dental pain: He does not report dental pain which he has had in the past but also reports NOT having a dentist.  Review of Systems - Per HPI. Additionally, he has a knot on his side that has been there a few months and is not painful unless he touches it.  PMH: Reviewed. [facility]  Surgery:  Cholecystectomy years ago.  [facility]  [facility]  Meds: Reviewed Has not been taking ezetimibe or fenofibrate because of cost. No med changes  NKDA  FH: Mother and GM (Mat), aunt (mat) died of cancer, unsure what kind. Son had cancer, but is in remission. (pt thinks it was Hodgkin's lymphoma). Lots of ppl in family with obesity and DM (thinks mother and father) HTN - unsure No heart disease that he knows of  SH: Used to drink 2 beers daily; none now, quit for lots of reasons 1 year ago. 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 Lives with cousin and her husb and 2 kids. Stable living envt, no DV. Finished 9th grade. Dropped out 10th  grade. Has thought about GED.   Objective:  Physical Exam BP 134/80  Pulse 98  Temp(Src) 98.6 F (37 C) (Oral)  Ht 5' 8.25" (1.734 m)  Wt 242 lb (109.77 kg)  BMI 36.51 kg/m2 GEN: NAD HEENT: Atraumatic, normocephalic, neck supple, EOMI, sclera clear, poor dentition CV: RRR, no murmurs, rubs, or gallops PULM: CTAB, normal effort ABD: Soft, nontender, nondistended, obese SKIN: No rash or cyanosis; warm and well-perfused EXTR: No lower extremity edema or calf tenderness PSYCH: Mood and affect euthymic, normal rate and volume of speech NEURO: Awake, alert, no focal deficits grossly, normal speech  Assessment:     Patrick SeenHerman [name] is a 43 y.o. male with h/o HTN, DM, HLD, and obesity here to establish care.    Plan:     # See problem list and after visit summary for problem-specific plans. - Of note, pt will f/u to discuss knot in his side.  # Health Maintenance:  - Provided list of dentists.  Follow-up: - Follow up in 2 days for diabetic teaching and to start lantus and blood glucose monitoring. - Follow up in 2-3 weeks when convneient for HTN, DM, chol discussion and later for HM visit.    Patrick SingletonMaria T Kymoni Lesperance, MD [facility]

## 2013-12-31 ENCOUNTER — Encounter: Payer: Self-pay | Admitting: Family Medicine

## 2013-12-31 NOTE — Assessment & Plan Note (Addendum)
Poorly controlled with A1c increased to 12 from 8.8 in 04/2012. Patient has likely been lost to follow up. - Continue oral medication (glipizide, metformin); refilled both. - Return in 2-3 days for diabetic teaching including how to administer insulin and reteach how to check fasting and 2 hour postprandial blood sugars.  - At this visit, would start patient on lantus 7.5 units daily and stop glipizide. - At this visit, reorder testing supplies. - Will need referral to dietitian for better management of diet. - Would benefit from referral to pharmacy for diabetic teaching as well. - At f/u, check fasting lipids. - Will need foot exam and plan for retinal exam at f/u.

## 2013-12-31 NOTE — Assessment & Plan Note (Addendum)
Well-controlled today, pt reports compliance with medications (lisinopril-HCTZ). - Continue current medications. - Checking BMET today. - Refilled lisinopril-HCTZ

## 2013-12-31 NOTE — Assessment & Plan Note (Addendum)
No current concerns but poor dentition and patient does not have dentist. - Strongly encouraged he follow-up with one and provided a list.

## 2014-01-01 ENCOUNTER — Ambulatory Visit: Payer: No Typology Code available for payment source | Admitting: Family Medicine

## 2014-01-01 ENCOUNTER — Encounter: Payer: Self-pay | Admitting: Family Medicine

## 2014-01-02 ENCOUNTER — Ambulatory Visit (INDEPENDENT_AMBULATORY_CARE_PROVIDER_SITE_OTHER): Payer: No Typology Code available for payment source | Admitting: Family Medicine

## 2014-01-02 ENCOUNTER — Encounter: Payer: Self-pay | Admitting: Family Medicine

## 2014-01-02 VITALS — BP 147/92 | HR 88 | Temp 98.6°F | Wt 242.0 lb

## 2014-01-02 DIAGNOSIS — I10 Essential (primary) hypertension: Secondary | ICD-10-CM

## 2014-01-02 DIAGNOSIS — E119 Type 2 diabetes mellitus without complications: Secondary | ICD-10-CM

## 2014-01-02 MED ORDER — INSULIN GLARGINE 100 UNIT/ML ~~LOC~~ SOLN: SUBCUTANEOUS | Status: DC

## 2014-01-02 MED ORDER — GLUCOSE BLOOD VI STRP: ORAL_STRIP | Status: DC

## 2014-01-02 MED ORDER — METFORMIN HCL 1000 MG PO TABS: ORAL_TABLET | ORAL | Status: DC

## 2014-01-02 NOTE — Assessment & Plan Note (Addendum)
Per Dr. Durene CalHunter: Had extensive discussion with patient about meaning of a1c and long term consequences of elevated CBGs. Patient interested in making lifestyle changes and he is hopeful to avoid insulin. I discussed with patient this would be an uphill battle but would be possible but would likely require loss of 50 lbs or more. This is a longterm goal but for the short term discussed safest to move forward with insulin therapy. We also increased his metformin to 1g BID. Glipizide was continued for now. Patient had never injected insulin before so I asked for the aid of the pharmacy team who instructed patient on insulin use. I advised patient about hypoglycemia and this was discussed with pharmacy team as well. I asked patient to start with 10 units and pharmacy team instructed with titration schedule. Patient to see Dr. Karie Schwalbe in 2-3 weeks.   Per Dr. Raymondo BandKoval: Diabetes currently poorly controlled and symptomatic hyperglyemica.   Denies hypoglycemic events and is able to verbalize appropriate hypoglycemia management plan.  Reports adherence with medication. Control is suboptimal due to dietary indiscretion, . Added basal insulin Lantus (insulin glargine). Patient will continue to titrate 1 unit/day if fasting CBGs > 100mg /dl until fasting CBGs reach goal or next visit. Written patient instructions provided.  [facility] meter[facility]  [facility] Rx [facility] to [facility].  Follow up in Pharmacist Clinic Visit in 2-3 weeks.   Total time in face to face counseling 40 minutes.  Patient seen with Claudie LeachLauren Gray, PharmD Candidate.

## 2014-01-02 NOTE — Progress Notes (Signed)
S:    Patient arrives in good spirits.   He is willing to initiate insulin to help with his blood glucose control.     He has NOT taken insulin previously.    He does NOT currently test his blood sugar but is willing to pick up a meter and [facility]     O:  . Lab Results  Component Value Date   HGBA1C 12.0 12/30/2013     home CBG readings of - NONE available at this time.    A/P: Diabetes currently poorly controlled and symptomatic hyperglyemica.   Denies hypoglycemic events and is able to verbalize appropriate hypoglycemia management plan.  Reports adherence with medication. Control is suboptimal due to dietary indiscretion, . Added basal insulin Lantus (insulin glargine). Patient will continue to titrate 1 unit/day if fasting CBGs > 100mg /dl until fasting CBGs reach goal or next visit. Written patient instructions provided.  Prescriptions for meter, strips and syringes [facility].  Lantus Rx send to [facility].  Follow up in Pharmacist Clinic Visit in 2-3 weeks.   Total time in face to face counseling 40 minutes.  Patient seen with Claudie LeachLauren Gray, PharmD Candidate.

## 2014-01-02 NOTE — Assessment & Plan Note (Signed)
Poorly controlled today as has not picked up lisinopril-hctz yet today. I instructed patient on importance of not missing doses. F/u next visit.

## 2014-01-02 NOTE — Patient Instructions (Addendum)
We are going to start you on 10 units of Lantus daily. Pharmacy will help you with samples today and getting set up with [facility].  Start taking 2 of your 500mg  pills of metformin twice a day when you run out, pick up the 1000mg  pills. Keep taking glipizide for now.   See Dr. Karie Schwalbe in 2 weeks, Dr. Durene Cal  Hypoglycemia (Low Blood Sugar) Hypoglycemia is when the glucose (sugar) in your blood is too low. Hypoglycemia can happen for many reasons. It can happen to people with or without diabetes. Hypoglycemia can develop quickly and can be a medical emergency.  CAUSES  Having hypoglycemia does not mean that you will develop diabetes. Different causes include:  Missed or delayed meals or not enough carbohydrates eaten.  Medication overdose. This could be by accident or deliberate. If by accident, your medication may need to be adjusted or changed.  Exercise or increased activity without adjustments in carbohydrates or medications.  A nerve disorder that affects body functions like your heart rate, blood pressure and digestion (autonomic neuropathy).  A condition where the stomach muscles do not function properly (gastroparesis). Therefore, medications may not absorb properly.  The inability to recognize the signs of hypoglycemia (hypoglycemic unawareness).  Absorption of insulin  may be altered.  Alcohol consumption.  Pregnancy/menstrual cycles/postpartum. This may be due to hormones.  Certain kinds of tumors. This is very rare. SYMPTOMS   Sweating.  Hunger.  Dizziness.  Blurred vision.  Drowsiness.  Weakness.  Headache.  Rapid heart beat.  Shakiness.  Nervousness. DIAGNOSIS  Diagnosis is made by monitoring blood glucose in one or all of the following ways:  Fingerstick blood glucose monitoring.  Laboratory results. TREATMENT  If you think your blood glucose is low:  Check your blood glucose, if possible. If it is less than 70 mg/dl, take one of the following:  3-4  glucose tablets.   cup juice (prefer clear like apple).   cup "regular" soda pop.  1 cup milk.  -1 tube of glucose gel.  5-6 hard candies.  Do not over treat because your blood glucose (sugar) will only go too high.  Wait 15 minutes and recheck your blood glucose. If it is still less than 70 mg/dl (or below your target range), repeat treatment.  Eat a snack if it is more than one hour until your next meal. Sometimes, your blood glucose may go so low that you are unable to treat yourself. You may need someone to help you. You may even pass out or be unable to swallow. This may require you to get an injection of glucagon, which raises the blood glucose. HOME CARE INSTRUCTIONS  Check blood glucose as recommended by your caregiver.  Take medication as prescribed by your caregiver.  Follow your meal plan. Do not skip meals. Eat on time.  If you are going to drink alcohol, drink it only with meals.  Check your blood glucose before driving.  Check your blood glucose before and after exercise. If you exercise longer or different than usual, be sure to check blood glucose more frequently.  Always carry treatment with you. Glucose tablets are the easiest to carry.  Always wear medical alert jewelry or carry some form of identification that states that you have diabetes. This will alert people that you have diabetes. If you have hypoglycemia, they will have a better idea on what to do. SEEK MEDICAL CARE IF:   You are having problems keeping your blood sugar at target range.  You are having frequent episodes of hypoglycemia.  You feel you might be having side effects from your medicines.  You have symptoms of an illness that is not improving after 3-4 days.  You notice a change in vision or a new problem with your vision. SEEK IMMEDIATE MEDICAL CARE IF:   You are a family member or friend of a person whose blood glucose goes below 70 mg/dl and is accompanied  by:  Confusion.  A change in mental status.  The inability to swallow.  Passing out. Document Released: 11/28/2005 Document Revised: 02/20/2012 Document Reviewed: 03/26/2012 [facility] Patient Information 2014 [place] [place]. It was great seeing you today!  Please test your blood sugars each morning.   Record your blood sugar reading and bring the list of readings to your next visit.   Take lantus each morning.  On Friday morning take 10 units of Lantus.   On Saturday increase you Lantus by 1 Unit.  Take 11 Units Saturday.  Increase your lantus dose each morning by 1 unit until your morning fasting reading is 100-120.   Remember if you feel like you are going to pass out, jittery, dizzy, then you need to drink 4 oz of regular soda or juice to help raise you blood sugar, but only do this if you are feeling like you are going to pass out.   Follow up with the pharmacy clinic in 2 weeks with Dr. Raymondo BandKoval.

## 2014-01-02 NOTE — Progress Notes (Signed)
Patrick Conch, MD Phone: [phone]  Subjective:  Chief complaint-noted  DIABETES Type II Medications taking and tolerating-yes, metformin 1g daily, glipizide 20mg  total daily Blood Sugars per patient-does not check and is not sure where his meter is  Diet-many indescretions Regular Exercise-none  To see Dr. Benjamin Stain to discuss: Health Maintenance Due  Topic Date Due  . Foot Exam  04/18/1980  . Ophthalmology Exam  04/18/1980  . Pneumococcal Polysaccharide Vaccine (##2) 02/15/2010  . Urine Microalbumin  07/31/2011  . Influenza Vaccine  07/12/2013  On Aspirin-will need to discuss with PCP On statin-lipids at next PCP visit Daily foot monitoring-no  ROS- Endorses intermittent Polyuria. Denies Hypoglycemia symptoms (shaky, sweaty, hungry, weak anxious, tremor, palpitations, confusion, behavior change).  No nausea/vomiting/belly pain  Hemoglobin a1c:  Lab Results  Component Value Date   HGBA1C 12.0 12/30/2013   HGBA1C 9.1 07/30/2010   HGBA1C 8.8 05/13/2009   Hypertension BP Readings from Last 3 Encounters:  01/02/14 147/92  12/30/13 134/80  11/19/12 146/103  Home BP monitoring-no Compliant with medications-no, Has to pick up lisinopril-hctz later today and did not take today ROS-No chest pain or shortness of breath.  Past Medical History-obesity, dyslipidemia, hypertension, diabetes.  Medications- reviewed and updated Current Outpatient Prescriptions  Medication Sig Dispense Refill  . glipiZIDE (GLUCOTROL) 10 MG tablet TAKE ONE TABLET BY MOUTH TWICE DAILY BEFORE  MEALS  60 tablet  5  . lisinopril-hydrochlorothiazide (PRINZIDE,ZESTORETIC) 10-12.5 MG per tablet TAKE ONE TABLET BY MOUTH EVERY DAY  30 tablet  5  . metFORMIN (GLUCOPHAGE) 1000 MG tablet TAKE ONE TABLET BY MOUTH TWICE DAILY WITH A MEAL  60 tablet  11  . ezetimibe (ZETIA) 10 MG tablet Take 1 tablet (10 mg total) by mouth daily.  30 tablet  11  . fenofibrate (TRICOR) 145 MG tablet Take 1 tablet (145 mg total)  by mouth daily.  30 tablet  11  . glucose blood (WAVESENSE PRESTO) test strip Use as instructed - Dispense QS for up to twice daily testing.  50 each  12  . insulin glargine (LANTUS) 100 UNIT/ML injection Inject once daily in the morning.  Dose is up to 60 units per day as directed.  10 mL  11   No current facility-administered medications for this visit.   Objective: BP 147/92  Pulse 88  Temp(Src) 98.6 F (37 C) (Oral)  Wt 242 lb (109.77 kg) Gen: NAD, resting comfortably CV: RRR no murmurs rubs or gallops Lungs: CTAB no crackles, wheeze, rhonchi Ext: no edema  Assessment/Plan:  DIABETES MELLITUS, TYPE II Per Dr. Durene Cal: Had extensive discussion with patient about meaning of a1c and long term consequences of elevated CBGs. Patient interested in making lifestyle changes and he is hopeful to avoid insulin. I discussed with patient this would be an uphill battle but would be possible but would likely require loss of 50 lbs or more. This is a longterm goal but for the short term discussed safest to move forward with insulin therapy. We also increased his metformin to 1g BID. Glipizide was continued for now. Patient had never injected insulin before so I asked for the aid of the pharmacy team who instructed patient on insulin use. I advised patient about hypoglycemia and this was discussed with pharmacy team as well. I asked patient to start with 10 units and pharmacy team instructed with titration schedule. Patient to see Dr. Karie Schwalbe in 2-3 weeks.   Per Dr. Raymondo Band: Diabetes currently poorly controlled and symptomatic hyperglyemica.   Denies hypoglycemic events and  is able to verbalize appropriate hypoglycemia management plan.  Reports adherence with medication. Control is suboptimal due to dietary indiscretion, . Added basal insulin Lantus (insulin glargine). Patient will continue to titrate 1 unit/day if fasting CBGs > 100mg /dl until fasting CBGs reach goal or next visit. Written patient instructions  provided.  [facility] meter, strips [facility]  [facility] Rx [facility] to [facility].  Follow up in Pharmacist Clinic Visit in 2-3 weeks.   Total time in face to face counseling 40 minutes.  Patient seen with Claudie LeachLauren Gray, PharmD Candidate.  HYPERTENSION, BENIGN ESSENTIAL Poorly controlled today as has not picked up lisinopril-hctz yet today. I instructed patient on importance of not missing doses. F/u next visit.    >50% of 30 minute office visit was spent on counseling (diabetes, long term consequences, a1c meaning, diet, exercise) and coordination of care (enlisting pharmacy team for aid with teaching)    Meds ordered this encounter  Medications  . metFORMIN (GLUCOPHAGE) 1000 MG tablet    Sig: TAKE ONE TABLET BY MOUTH TWICE DAILY WITH A MEAL    Dispense:  60 tablet    Refill:  11  . DISCONTD: insulin glargine (LANTUS) 100 UNIT/ML injection    Sig: Inject once daily in the morning.    Dispense:  10 mL    Refill:  0    Order Specific Question:  Lot Number?    Answer:  [id number]    Order Specific Question:  Expiration Date?    Answer:  04/10/2016    Order Specific Question:  Quantity    Answer:  1  . glucose blood (WAVESENSE PRESTO) test strip    Sig: Use as instructed - Dispense QS for up to twice daily testing.    Dispense:  50 each    Refill:  12  . insulin glargine (LANTUS) 100 UNIT/ML injection    Sig: Inject once daily in the morning.  Dose is up to 60 units per day as directed.    Dispense:  10 mL    Refill:  11    Order Specific Question:  Lot Number?    Answer:  [id number]    Order Specific Question:  Expiration Date?    Answer:  04/10/2016    Order Specific Question:  Quantity    Answer:  1

## 2014-01-03 NOTE — Progress Notes (Signed)
Patient ID: Orvan SeenHerman Palazzi, male   DOB: 01/21/1971, 43 y.o.   MRN: [id number] Reviewed: Agree with Dr. Macky LowerKoval's and Dr. Erasmo LeventhalHunter's documentation and management.

## 2014-02-19 ENCOUNTER — Ambulatory Visit: Payer: No Typology Code available for payment source | Admitting: Family Medicine

## 2014-03-11 ENCOUNTER — Telehealth: Payer: Self-pay | Admitting: Family Medicine

## 2014-03-11 NOTE — Telephone Encounter (Signed)
Please let Patrick [name] know he is due for follow up of his diabetes. Thanks.   (Note to self for when pt follows up: - Discuss starting daily aspirin 81mg . - Pt is titrating up [facility]. [facility] current dose [facility] rx to [facility] at [phone]. - Accuretic is covered by [facility]. Discuss switching from lisinopril HCTZ with patient and if desires, send rx to [facility]. - If due for lipids, check. Consider starting statin. Crestor possibly free at [facility].) Patrick SingletonMaria T Sakib Noguez, MD

## 2014-03-12 NOTE — Telephone Encounter (Signed)
Appt April 23. Patrick [name], Patrick [name]

## 2014-04-03 ENCOUNTER — Ambulatory Visit: Payer: No Typology Code available for payment source | Admitting: Family Medicine

## 2014-05-01 ENCOUNTER — Encounter: Payer: No Typology Code available for payment source | Admitting: Family Medicine

## 2014-05-01 NOTE — Progress Notes (Signed)
Patient ID: Orvan SeenHerman [name], male   DOB: 02/17/1971, 43 y.o.   MRN: [id number]  Opened in error. Pt did not show for appt. This encounter was created in error - please disregard.

## 2014-06-04 ENCOUNTER — Telehealth: Payer: Self-pay | Admitting: Family Medicine

## 2014-06-04 NOTE — Telephone Encounter (Signed)
Called Pt about DM care. When Pt calls back, please schedule appointment to check BP, LDL, and A1C.  ° °

## 2014-06-28 ENCOUNTER — Other Ambulatory Visit: Payer: Self-pay | Admitting: Family Medicine

## 2014-07-05 ENCOUNTER — Emergency Department (HOSPITAL_COMMUNITY)
Admission: EM | Admit: 2014-07-05 | Discharge: 2014-07-05 | Disposition: A | Discharge status: Home / Self Care | Payer: Self-pay | Attending: Emergency Medicine | Admitting: Emergency Medicine

## 2014-07-05 ENCOUNTER — Encounter (HOSPITAL_COMMUNITY): Payer: Self-pay | Admitting: Emergency Medicine

## 2014-07-05 DIAGNOSIS — E669 Obesity, unspecified: Secondary | ICD-10-CM | POA: Insufficient documentation

## 2014-07-05 DIAGNOSIS — H6690 Otitis media, unspecified, unspecified ear: Secondary | ICD-10-CM | POA: Insufficient documentation

## 2014-07-05 DIAGNOSIS — K089 Disorder of teeth and supporting structures, unspecified: Secondary | ICD-10-CM | POA: Insufficient documentation

## 2014-07-05 DIAGNOSIS — I10 Essential (primary) hypertension: Secondary | ICD-10-CM | POA: Insufficient documentation

## 2014-07-05 DIAGNOSIS — Z79899 Other long term (current) drug therapy: Secondary | ICD-10-CM | POA: Insufficient documentation

## 2014-07-05 DIAGNOSIS — E119 Type 2 diabetes mellitus without complications: Secondary | ICD-10-CM | POA: Insufficient documentation

## 2014-07-05 DIAGNOSIS — Z794 Long term (current) use of insulin: Secondary | ICD-10-CM | POA: Insufficient documentation

## 2014-07-05 DIAGNOSIS — H9209 Otalgia, unspecified ear: Secondary | ICD-10-CM | POA: Insufficient documentation

## 2014-07-05 DIAGNOSIS — H6691 Otitis media, unspecified, right ear: Secondary | ICD-10-CM

## 2014-07-05 MED ORDER — HYDROCODONE-ACETAMINOPHEN 5-325 MG PO TABS
ORAL_TABLET | ORAL | Status: DC
Start: 2014-07-05 — End: 2015-01-15

## 2014-07-05 MED ORDER — HYDROCODONE-ACETAMINOPHEN 5-325 MG PO TABS
1.0000 | ORAL_TABLET | Freq: Once | ORAL | Status: AC
  Administered 2014-07-05: 1 via ORAL
  Filled 2014-07-05: qty 1

## 2014-07-05 MED ORDER — AMOXICILLIN 500 MG PO CAPS: 500.0000 mg | ORAL_CAPSULE | Freq: Three times a day (TID) | ORAL | Status: DC

## 2014-07-05 MED ORDER — AMOXICILLIN 500 MG PO CAPS
500.0000 mg | ORAL_CAPSULE | Freq: Once | ORAL | Status: AC
  Administered 2014-07-05: 500 mg via ORAL
  Filled 2014-07-05: qty 1

## 2014-07-05 NOTE — ED Notes (Signed)
Pt A+OX4, presents with c/o L ear pain and L sided dental pain x2 days.  Pt reports pain started in his ear "but i think it came from my tooth".  Pt denies fevers/chills or other complaints.  8/10 pain currently.  Pt reports taking tylenol without relief.  Skin PWD.  Speaking full/clear sentences.  NAD.

## 2014-07-05 NOTE — Discharge Instructions (Signed)
Take Vicodin for breakthrough pain, do not drink alcohol, drive, care for children or do other critical tasks while taking Vicodin.  Return to the emergency room for fever, change in vision, redness to the face that rapidly spreads towards the eye, nausea or vomiting, difficulty swallowing or shortness of breath.   Apply warm compresses to jaw throughout the day.   Take your antibiotics as directed and to the end of the course.   Followup with a dentist is very important for ongoing evaluation and management of recurrent dental pain. Return to emergency department for emergent changing or worsening symptoms."  Low-cost dental clinic: Patrick [name]**Patrick [name]  at [phone]**  **Patrick [name] at [phone] [address]**    You may also call [phone]  Dental Assistance If the dentist on-call cannot see you, please use the resources below:   Patients with Medicaid: [facility] [phone], [phone] [address], [phone]  If unable to pay, or uninsured, contact [facility] [phone]) or [facility] [phone] in [place], [phone] in [facility]) to become qualified for the adult dental clinic  Other Low-Cost Community Dental Services: [facility]- [address], [place], [place], [id number]    [phone], Ext. [phone]    2nd and 4th Thursday of the month at 6:30am    10 clients each day by appointment, can sometimes see walk-in     patients if someone does not show for an appointment [facility]- [address], [place], [place], [id number]    [phone] [facility]- [address], [place], [place], [address]    [phone]  [facility]- [phone] [facility]- [phone] [facility]- [phone]

## 2014-07-05 NOTE — ED Provider Notes (Signed)
CSN: [id number]     Arrival date & time 07/05/14  1122 History   First MD Initiated Contact with Patient 07/05/14 1146     Chief Complaint  Patient presents with  . Otalgia  . Dental Pain     (Consider location/radiation/quality/duration/timing/severity/associated sxs/prior Treatment) HPI  Orvan SeenHerman [name] is a 43 y.o. male  complaining of left-sided ear pain possibly dental pain worsening over the course of 2 days. Patient denies rhinorrhea, nasal congestion, decreased tearing sensation, tinnitus, fever, chills, cough, shortness of breath. Rates pain at 8/10. He's been taking Tylenol home with little relief.   Past Medical History  Diagnosis Date  . Diabetes mellitus without complication   . Hyperlipidemia   . Hypertension   . Obesity    Past Surgical History  Procedure Laterality Date  . Cholecystectomy  2000   Family History  Problem Relation Age of Onset  . Cancer Mother   . Diabetes Mother   . Hypertension Mother   . Diabetes Sister   . Cancer Maternal Grandmother   . Cancer Son   . Cancer Maternal Aunt    History  Substance Use Topics  . Smoking status: Never Smoker   . Smokeless tobacco: Never Used  . Alcohol Use: No     Comment: 2014 quit, was drinking 2  beers daily at that time    Review of Systems  10 systems reviewed and found to be negative, except as noted in the HPI.   Allergies  Review of patient's allergies indicates no known allergies.  Home Medications   Prior to Admission medications   Medication Sig Start Date End Date Taking? Authorizing Provider  acetaminophen (TYLENOL) 500 MG tablet Take 1,000 mg by mouth every 6 (six) hours as needed for moderate pain.   Yes Historical Provider, MD  glipiZIDE (GLUCOTROL) 10 MG tablet Take 1 tablet (10 mg total) by mouth 2 (two) times daily before a meal. Overdue for MD follow up   Yes Leona SingletonMaria T Thekkekandam, MD  glucose blood (WAVESENSE PRESTO) test strip Use as instructed - Dispense QS for up to twice  daily testing. 01/02/14  Yes Sanjuana LettersWilliam Arthur Hensel, MD  insulin glargine (LANTUS) 100 UNIT/ML injection Inject 30 Units into the skin every morning.   Yes Historical Provider, MD  lisinopril-hydrochlorothiazide (PRINZIDE,ZESTORETIC) 10-12.5 MG per tablet Take 1 tablet by mouth daily. Overdue for MD follow up   Yes Leona SingletonMaria T Thekkekandam, MD  metFORMIN (GLUCOPHAGE) 1000 MG tablet Take 1,000 mg by mouth 2 (two) times daily with a meal.   Yes Historical Provider, MD  insulin glargine (LANTUS) 100 UNIT/ML injection Inject once daily in the morning.  Dose is up to 60 units per day as directed. 01/02/14   Sanjuana LettersWilliam Arthur Hensel, MD   BP 149/89  Pulse 88  Temp(Src) 98.9 F (37.2 C) (Oral)  Resp 18  SpO2 99% Physical Exam  Nursing note and vitals reviewed. Constitutional: He is oriented to person, place, and time. He appears well-developed and well-nourished. No distress.  HENT:  Head: Normocephalic.  Mouth/Throat: Oropharynx is clear and moist.  Left tympanic membrane slightly erythematous and bulging with dulled light reflex.  No mastoid tenderness to palpation  Generally good dentition, no gingival swelling, erythema or tenderness to palpation. Patient is handling their secretions. There is no tenderness to palpation or firmness underneath tongue bilaterally. No trismus.    Eyes: Conjunctivae and EOM are normal. Pupils are equal, round, and reactive to light.  Neck: Normal range of motion.  Cardiovascular: Normal  rate, regular rhythm and intact distal pulses.   Pulmonary/Chest: Effort normal and breath sounds normal. No stridor. No respiratory distress. He has no wheezes. He has no rales. He exhibits no tenderness.  Abdominal: Soft. Bowel sounds are normal. He exhibits no distension and no mass. There is no tenderness. There is no rebound and no guarding.  Musculoskeletal: Normal range of motion. He exhibits no edema and no tenderness.  Lymphadenopathy:    He has no cervical adenopathy.   Neurological: He is alert and oriented to person, place, and time.  Psychiatric: He has a normal mood and affect.    ED Course  Procedures (including critical care time) Labs Review Labs Reviewed - No data to display  Imaging Review No results found.   EKG Interpretation None      MDM   Final diagnoses:  None    Filed Vitals:   07/05/14 1137  BP: 149/89  Pulse: 88  Temp: 98.9 F (37.2 C)  TempSrc: Oral  Resp: 18  SpO2: 99%    Medications  amoxicillin (AMOXIL) capsule 500 mg (500 mg Oral Given 07/05/14 1215)  HYDROcodone-acetaminophen (NORCO/VICODIN) 5-325 MG per tablet 1 tablet (1 tablet Oral Given 07/05/14 1215)    Patrick [name] is a 43 y.o. male presenting with left ear and possible tooth pain. No signs of any dental issues, however left TM is dulled and erythematous. Will treat for acute otitis media.  Evaluation does not show pathology that would require ongoing emergent intervention or inpatient treatment. Pt is hemodynamically stable and mentating appropriately. Discussed findings and plan with patient/guardian, who agrees with care plan. All questions answered. Return precautions discussed and outpatient follow up given.   Discharge Medication List as of 07/05/2014 12:07 PM    START taking these medications   Details  amoxicillin (AMOXIL) 500 MG capsule Take 1 capsule (500 mg total) by mouth 3 (three) times daily., Starting 07/05/2014, Until Discontinued, Print    HYDROcodone-acetaminophen (NORCO/VICODIN) 5-325 MG per tablet Take 1-2 tablets by mouth every 6 hours as needed for pain., [employer], PA-C 07/05/14 1800

## 2014-07-06 NOTE — ED Provider Notes (Signed)
Medical screening examination/treatment/procedure(s) were performed by non-physician practitioner and as supervising physician I was immediately available for consultation/collaboration.    Esten Dollar, MD 07/06/14 0800 

## 2014-07-25 ENCOUNTER — Emergency Department (HOSPITAL_COMMUNITY)
Admission: EM | Admit: 2014-07-25 | Discharge: 2014-07-25 | Disposition: A | Discharge status: Home / Self Care | Payer: Self-pay | Attending: Emergency Medicine | Admitting: Emergency Medicine

## 2014-07-25 ENCOUNTER — Encounter (HOSPITAL_COMMUNITY): Payer: Self-pay | Admitting: Emergency Medicine

## 2014-07-25 DIAGNOSIS — Z79899 Other long term (current) drug therapy: Secondary | ICD-10-CM | POA: Insufficient documentation

## 2014-07-25 DIAGNOSIS — H9209 Otalgia, unspecified ear: Secondary | ICD-10-CM | POA: Insufficient documentation

## 2014-07-25 DIAGNOSIS — E669 Obesity, unspecified: Secondary | ICD-10-CM | POA: Insufficient documentation

## 2014-07-25 DIAGNOSIS — K0889 Other specified disorders of teeth and supporting structures: Secondary | ICD-10-CM

## 2014-07-25 DIAGNOSIS — Z794 Long term (current) use of insulin: Secondary | ICD-10-CM | POA: Insufficient documentation

## 2014-07-25 DIAGNOSIS — E119 Type 2 diabetes mellitus without complications: Secondary | ICD-10-CM | POA: Insufficient documentation

## 2014-07-25 DIAGNOSIS — I10 Essential (primary) hypertension: Secondary | ICD-10-CM | POA: Insufficient documentation

## 2014-07-25 DIAGNOSIS — K089 Disorder of teeth and supporting structures, unspecified: Secondary | ICD-10-CM | POA: Insufficient documentation

## 2014-07-25 DIAGNOSIS — Z792 Long term (current) use of antibiotics: Secondary | ICD-10-CM | POA: Insufficient documentation

## 2014-07-25 MED ORDER — PENICILLIN V POTASSIUM 500 MG PO TABS: 500.0000 mg | ORAL_TABLET | Freq: Four times a day (QID) | ORAL | Status: AC

## 2014-07-25 MED ORDER — TRAMADOL HCL 50 MG PO TABS
50.0000 mg | ORAL_TABLET | Freq: Four times a day (QID) | ORAL | Status: DC | PRN
Start: 2014-07-25 — End: 2015-01-15

## 2014-07-25 NOTE — ED Notes (Signed)
Pt reports a few weeks of L ear pain --was seen here and was given abx and has finished course; pt now reports having L lower dental pain; worse when bending over

## 2014-07-25 NOTE — ED Notes (Signed)
Pt here in July-dx'd with left ear infection. Took Abx. Returns today for pain left lower face.

## 2014-07-25 NOTE — ED Provider Notes (Signed)
CSN: [id number]     Arrival date & time 07/25/14  4098 History  This chart was scribed for Rosezella [place]. Rickey Barbara, working with Lyanne Co, MD by Chestine Spore, ED Scribe. The patient was seen in room TR06C/TR06C at 9:51 AM.    Chief Complaint  Patient presents with  . Otalgia  . Dental Pain     The history is provided by the patient. No language interpreter was used.   HPI Comments: Patrick [name] is a 43 y.o. male who presents to the Emergency Department complaining of otalgia onset 2-3 weeks ago. He states that he was seen at [facility] on 7/25 for his left ear pain, he was given antibiotics and he has finished the treatment. He states that the last time he came he was dx with an ear infection. He states that he is now having left lower dental pain. He describes the pain as shooting. He states that the dental pain is worse with bending over. He states that he could feel the difference from his left and right lower jaw this morning. He states that he does not have a dentist. He denies fever and any other associated symptoms.  No intervention tried PTA.  Past Medical History  Diagnosis Date  . Diabetes mellitus without complication   . Hyperlipidemia   . Hypertension   . Obesity    Past Surgical History  Procedure Laterality Date  . Cholecystectomy  2000   Family History  Problem Relation Age of Onset  . Cancer Mother   . Diabetes Mother   . Hypertension Mother   . Diabetes Sister   . Cancer Maternal Grandmother   . Cancer Son   . Cancer Maternal Aunt    History  Substance Use Topics  . Smoking status: Never Smoker   . Smokeless tobacco: Never Used  . Alcohol Use: No     Comment: 2014 quit, was drinking 2  beers daily at that time    Review of Systems  Constitutional: Negative for fever.  HENT: Positive for ear pain (left ear).   All other systems reviewed and are negative.     Allergies  Review of patient's allergies indicates no known allergies.  Home Medications    Prior to Admission medications   Medication Sig Start Date End Date Taking? Authorizing Provider  acetaminophen (TYLENOL) 500 MG tablet Take 1,000 mg by mouth every 6 (six) hours as needed for moderate pain.    Historical Provider, MD  amoxicillin (AMOXIL) 500 MG capsule Take 1 capsule (500 mg total) by mouth 3 (three) times daily. 07/05/14   Nicole Pisciotta, PA-C  glipiZIDE (GLUCOTROL) 10 MG tablet Take 1 tablet (10 mg total) by mouth 2 (two) times daily before a meal. Overdue for MD follow up    Leona Singleton, MD  glucose blood (WAVESENSE PRESTO) test strip Use as instructed - Dispense QS for up to twice daily testing. 01/02/14   Sanjuana Letters, MD  HYDROcodone-acetaminophen (NORCO/VICODIN) 5-325 MG per tablet Take 1-2 tablets by mouth every 6 hours as needed for pain. 07/05/14   Nicole Pisciotta, PA-C  insulin glargine (LANTUS) 100 UNIT/ML injection Inject once daily in the morning.  Dose is up to 60 units per day as directed. 01/02/14   Sanjuana Letters, MD  insulin glargine (LANTUS) 100 UNIT/ML injection Inject 30 Units into the skin every morning.    Historical Provider, MD  lisinopril-hydrochlorothiazide (PRINZIDE,ZESTORETIC) 10-12.5 MG per tablet Take 1 tablet by mouth daily. Overdue for  MD follow up    Leona SingletonMaria T Thekkekandam, MD  metFORMIN (GLUCOPHAGE) 1000 MG tablet Take 1,000 mg by mouth 2 (two) times daily with a meal.    Historical Provider, MD   BP 148/79  Pulse 87  Temp(Src) 98.1 F (36.7 C) (Oral)  Resp 18  Ht 5\' 9"  (1.753 m)  Wt 250 lb (113.399 kg)  BMI 36.90 kg/m2  SpO2 100%  Physical Exam  Nursing note and vitals reviewed. Constitutional: He is oriented to person, place, and time. He appears well-developed and well-nourished.  HENT:  Head: Normocephalic and atraumatic.  Right Ear: Tympanic membrane, external ear and ear canal normal.  Left Ear: Tympanic membrane, external ear and ear canal normal.  Mouth/Throat: Uvula is midline, oropharynx is clear  and moist and mucous membranes are normal. Normal dentition. No dental caries. No oropharyngeal exudate, posterior oropharyngeal edema, posterior oropharyngeal erythema or tonsillar abscesses.  Teeth largely in good dentition,  Left lower molars tender to palpation, surrounding gingiva swollen and erythematous without definitive abscess, handling secretions appropriately, no trismus  Eyes: Conjunctivae and EOM are normal. Pupils are equal, round, and reactive to light.  Neck: Normal range of motion.  Cardiovascular: Normal rate, regular rhythm and normal heart sounds.   Pulmonary/Chest: Effort normal and breath sounds normal.  Abdominal: Soft. Bowel sounds are normal.  Musculoskeletal: Normal range of motion.  Neurological: He is alert and oriented to person, place, and time.  Skin: Skin is warm and dry.  Psychiatric: He has a normal mood and affect.    ED Course  Procedures (including critical care time) DIAGNOSTIC STUDIES: Oxygen Saturation is 100% on room air, normal by my interpretation.    COORDINATION OF CARE: 9:55 AM-Discussed treatment plan which includes Veetid and Ultram with pt at bedside and pt agreed to plan.   Labs Review Labs Reviewed - No data to display  Imaging Review No results found.   EKG Interpretation None      MDM   Final diagnoses:  Pain, dental   Dental pain, questionable early abscess formation.  Airway patent, handling secretions well.  TM's normal in appearance bilaterally.  Will start on pen VK and tramadol, FU with dentist.  Referral and flyer for free dental clinic given.  Discussed plan with patient, he/she acknowledged understanding and agreed with plan of care.  Return precautions given for new or worsening symptoms.  I personally performed the services described in this documentation, which was scribed in my presence. The recorded information has been reviewed and is accurate.   Garlon HatchetLisa M Janesia Joswick, PA-C 07/25/14 1006

## 2014-07-25 NOTE — ED Provider Notes (Signed)
Medical screening examination/treatment/procedure(s) were performed by non-physician practitioner and as supervising physician I was immediately available for consultation/collaboration.   EKG Interpretation None        Frank Novelo M Gevon Markus, MD 07/25/14 2206 

## 2014-07-25 NOTE — Discharge Instructions (Signed)
Take the prescribed medication as directed. Follow-up with dentist-- call to schedule appt.  May also attend free dental clinic, see attached flyer. Return to the ED for new or worsening symptoms.

## 2014-07-29 ENCOUNTER — Other Ambulatory Visit: Payer: Self-pay | Admitting: Family Medicine

## 2014-08-27 ENCOUNTER — Other Ambulatory Visit: Payer: Self-pay | Admitting: Family Medicine

## 2014-08-27 NOTE — Telephone Encounter (Signed)
Has appt 08/29/2014. Fleeger, Maryjo Rochester

## 2014-08-27 NOTE — Telephone Encounter (Signed)
Refilling again for one month but please call patient and let him know that he really needs follow up in clinic to see how his diabetes control is. It is unsafe for him to keep taking medication without following up to see how his control is.  Leona Singleton, MD

## 2014-08-29 ENCOUNTER — Ambulatory Visit: Payer: Self-pay | Admitting: Family Medicine

## 2014-08-29 ENCOUNTER — Ambulatory Visit: Payer: Self-pay

## 2014-09-01 ENCOUNTER — Ambulatory Visit: Payer: Self-pay

## 2014-09-24 ENCOUNTER — Other Ambulatory Visit: Payer: Self-pay | Admitting: Family Medicine

## 2014-09-26 NOTE — Telephone Encounter (Signed)
Refilling. It appears he has appt with me late October. Patrick SingletonMaria T Bettey Muraoka, MD

## 2014-10-01 ENCOUNTER — Ambulatory Visit: Payer: Self-pay | Admitting: Family Medicine

## 2014-10-23 ENCOUNTER — Other Ambulatory Visit: Payer: Self-pay | Admitting: Family Medicine

## 2014-10-23 NOTE — Telephone Encounter (Signed)
Has appt with me 11/20. Will refill but he is well overdue for follow up so if he misses this appt, will need to call pt.  Leona SingletonMaria T Emry Tobin, MD

## 2014-10-31 ENCOUNTER — Encounter: Payer: Self-pay | Admitting: Family Medicine

## 2014-10-31 ENCOUNTER — Ambulatory Visit (HOSPITAL_COMMUNITY)
Admission: RE | Admit: 2014-10-31 | Discharge: 2014-10-31 | Disposition: A | Discharge status: Home / Self Care | Payer: Self-pay | Source: Ambulatory Visit | Attending: Family Medicine | Admitting: Family Medicine

## 2014-10-31 ENCOUNTER — Ambulatory Visit (INDEPENDENT_AMBULATORY_CARE_PROVIDER_SITE_OTHER): Payer: Self-pay | Admitting: Family Medicine

## 2014-10-31 VITALS — BP 146/70 | HR 72 | Temp 97.8°F | Ht 69.0 in | Wt 248.0 lb

## 2014-10-31 DIAGNOSIS — E119 Type 2 diabetes mellitus without complications: Secondary | ICD-10-CM

## 2014-10-31 DIAGNOSIS — I10 Essential (primary) hypertension: Secondary | ICD-10-CM

## 2014-10-31 DIAGNOSIS — R079 Chest pain, unspecified: Secondary | ICD-10-CM | POA: Insufficient documentation

## 2014-10-31 DIAGNOSIS — K089 Disorder of teeth and supporting structures, unspecified: Secondary | ICD-10-CM

## 2014-10-31 LAB — POCT GLYCOSYLATED HEMOGLOBIN (HGB A1C): HEMOGLOBIN A1C: 10.4

## 2014-10-31 MED ORDER — CARVEDILOL 6.25 MG PO TABS: 6.2500 mg | ORAL_TABLET | Freq: Two times a day (BID) | ORAL | Status: DC

## 2014-10-31 MED ORDER — ASPIRIN EC 81 MG PO TBEC: 81.0000 mg | DELAYED_RELEASE_TABLET | Freq: Every day | ORAL | Status: AC

## 2014-10-31 NOTE — Assessment & Plan Note (Signed)
Tooth pain lower left molar, with tenderness on tapping tooth. No sign of abscess on exam and afebrile.. Likely inflamed and abscess may not be far behind. - Has orange card. REferral for dentist placed. - UC/ED reasons for immediate visit discussed.

## 2014-10-31 NOTE — Progress Notes (Signed)
Patient ID: Patrick SeenHerman Patrick [name], male   DOB: 04/19/1971, 43 y.o.   MRN: [id number] Subjective:   CC: Follow up HTN and DM  HPI:   Chest pain Brought up during f/u of DM. Has been intermittent, present for 1 month duration. Not present currently. Nonexertional, random onset. Left-sided and radiates to left shoulder occasionally. Feels like pressure or squeezing. Lasts 1 min. Resolves spontaneously, sometimes with movement. No nausea, diaphoresis, dyspnea, dizziness or fainting. Happens sporadically, about weekly. Has not tried medication.  F/u HTN Lisinopril-HCTZ daily. Does not check home BP. Symptoms: No syncope, dizziness, vision changes, or dyspnea. Chest pain is present (see below). Poorly controlled last visit due to not picking up lisinopril-HCTZ.  Creatinine Jan was 0.9.   F/u DM Taking metfomin and glipizide regularly. Lantus started January due to A1c increase to 12 from 9. Dose: Had titrated up to 20 units. Took Jan to Aug. Home blood sugar: Postprandially, was normal.  September, had trouble getting insulin from health department. On calling, they have the medication there for him. Has been off it since then. Symptoms: No hypoglycemic symptoms.  Random BG at home has been 300s. Chest pain per above. Last week had polyuria. None this week. No polydypsia.  Tooth pain Patrick [name] in July for left tooth and ear pain. TM dulled and erythematous, treated for AOM with amoxicillin. Patrick [name] again Aug, questionable early abscess formation. Dosed pen VK and tramadol and recommended dentist. Pt has no insurance. No fevers or current other systemic symptoms. No signs of abscess on exdam today. Pain in same location (left lower posterior molars).  Review of Systems - Per HPI.   PMH - shoulder pain, proteinuria, obesity, HTN, dyslipidemia, DM  Smoking status: Nonsmoker, no alcohol or drug use    Objective:  Physical Exam BP 146/70 mmHg  Pulse 72  Temp(Src) 97.8 F (36.6 C) (Oral)  Ht 5'  9" (1.753 m)  Wt 248 lb (112.492 kg)  BMI 36.61 kg/m2 GEN: NAD CV: RRR, no m/r/g PULM: CTAB, normal effort EXTR: No LE edema or calf tenderness HEENT: O/p clear, poor dentition, left lower back molar breaking through gums and gums are crowding the tooth; gingivitis present; tenderness on palpation of this tooth; no pocket of fluctuance or bleeding Patrick [name] or palpated    Assessment:     Patrick SeenHerman Patrick [name] is a 43 y.o. male here for f/u of HTN and DM and to discuss tooth pain and chest pain.    Plan:     # See problem list and after visit summary for problem-specific plans.   # Health Maintenance: Declined flu shot  Follow-up: Follow up in 2 weeks for f/u HTN and DM with me or Dr Raymondo BandKoval.   Leona SingletonMaria T Stiles Maxcy, MD [facility]

## 2014-10-31 NOTE — Assessment & Plan Note (Signed)
Poorly controlled though 10.4, down from 12 previously. Chest pain per above. Polyuria last week. Out of lantus since Sept. - Pick up lantus, start back at 15 units since off for a week. Increase by 1 unit every morning for fasting BG >100. - Lipids today>>likely start moderate intensity statin but await results - Eye check never done. Refer to ophthalmology. - Dr Gerilyn PilgrimSykes referral placed. - Start aspirin 81mg  daily. - Diet/exercise. Cut back on sodas/sweets. - F/u 2 weeks me or Dr Raymondo BandKoval. Foot exam on follow up.

## 2014-10-31 NOTE — Assessment & Plan Note (Signed)
Very elevated initially (160s/120s) but BP recheck 145/70. Per pt, home checks regular. Chest pain present intermittently though not currently. Creatinine checked Jan and normal. - BMET today - f/u within 1 month (me, Koval) - Coreg started today. Hold if dizziness develops. Continue other medications. - Check BP regularly at home.

## 2014-10-31 NOTE — Assessment & Plan Note (Signed)
Pain not currently present and is intermittent, left-sided and radiating to left shoulder. Given h/o poorly-controlled DM and HTN, worrisome for cardiac source. Other ddx includes GERD, anxiety, MSK. - Baseline EKG>>>flipped T in III, aVR, and V1. No prior to compare. - Cards referral - appt made 12/16. - Stress test ordered - Scheduled for 12/11. - Start carvedilol 6.25mg  BID and aspirin 81mg  daily. - Reasons to go to ED discussed.

## 2014-10-31 NOTE — Patient Instructions (Addendum)
Good to see you.  For your chest pain, we are getting an EKG today. - You should hear from cardiology about an appointment. - You should also hear about a stress test. - Start carvedilol twice daily.- Go to the ED if you have severe chest pain, dizziness, fainting, or other concerns.  For your diabetes - Pick up your lantus. - Start taking 15 units daily and increase by 1 unit every morning for every fasting blood sugar >100.   - Check your blood sugar every morning right when you wake up before you have eaten anything (fasting blood sugar). - Get your eye exam - Schedule an appointment with Dr Gerilyn PilgrimSykes (nutritionist). - Follow up with me or Dr Raymondo BandKoval in 2 weeks. - Work on [employer]your diet and exercise. Try to cut back on sodas and sweet foods.   For your blood pressure - Check your blood pressure regularly. Normal blood pressure is less than 140/80. - If you have dizziness, hold one dose of the carvedilol and call us.  For your teeth - You need to see a dentist. I have placed a referral. If you do not hear in 2 weeks, call us. - Go to the urgent care or ED if you have fevers or severe pain.  We are getting labs today and will call if anything is NOT normal.

## 2014-11-01 LAB — BASIC METABOLIC PANEL
BUN: 12 mg/dL (ref 6–23)
CHLORIDE: 98 meq/L (ref 96–112)
CO2: 25 meq/L (ref 19–32)
CREATININE: 0.9 mg/dL (ref 0.50–1.35)
Calcium: 9.8 mg/dL (ref 8.4–10.5)
GLUCOSE: 155 mg/dL — AB (ref 70–99)
POTASSIUM: 4.5 meq/L (ref 3.5–5.3)
Sodium: 135 mEq/L (ref 135–145)

## 2014-11-01 LAB — LIPID PANEL
CHOLESTEROL: 151 mg/dL (ref 0–200)
HDL: 21 mg/dL — ABNORMAL LOW (ref >39)
LDL Cholesterol: 58 mg/dL (ref 0–99)
Total CHOL/HDL Ratio: 7.2 Ratio
Triglycerides: 361 mg/dL — ABNORMAL HIGH (ref <150)
VLDL: 72 mg/dL — ABNORMAL HIGH (ref 0–40)

## 2014-11-17 ENCOUNTER — Telehealth: Payer: Self-pay | Admitting: Family Medicine

## 2014-11-17 DIAGNOSIS — E785 Hyperlipidemia, unspecified: Secondary | ICD-10-CM

## 2014-11-17 MED ORDER — ATORVASTATIN CALCIUM 40 MG PO TABS: 40.0000 mg | ORAL_TABLET | Freq: Every day | ORAL | Status: DC

## 2014-11-17 NOTE — Assessment & Plan Note (Signed)
10 year ASCVD risk based on lipid panel 10/2014 is 17.8%. On zetia and tricor in the past, but unsure why not on statin. Will start atorvastatin 40mg  daily and f/u in 6 months.

## 2014-11-17 NOTE — Telephone Encounter (Signed)
Please call patient to let him know his 10 year ASCVD (stroke or heart attack) risk based on lipid panel 10/2014 is 17.8%. On zetia and tricor in the past, but unsure why not on statin. Prescribing atorvastatin 40mg  daily and f/u in 6 months. If he takes daily, this has potential to greatly reduce his ASCVD risk. Please let me know if he cannot afford/insurance does not cover. Thanks. Leona SingletonMaria T Alonna Bartling, MD

## 2014-11-17 NOTE — Telephone Encounter (Signed)
Pt informed and agreeable. Patrick [name] Dawn  

## 2014-11-21 ENCOUNTER — Other Ambulatory Visit: Payer: Self-pay | Admitting: Family Medicine

## 2014-11-21 ENCOUNTER — Encounter: Payer: Self-pay | Admitting: Nurse Practitioner

## 2014-11-25 ENCOUNTER — Encounter: Payer: Self-pay | Admitting: *Deleted

## 2014-11-25 NOTE — Telephone Encounter (Signed)
Please let patient know I have refilled lisinopril-HCTZ and glipizide but it is important he follows up this month (with me or if I'm not available, with another provider), since we restarted lantus last visit. Thanks! Leona SingletonMaria T Cong Hightower, MD

## 2014-11-25 NOTE — Telephone Encounter (Signed)
appt made for 12/22/2014. Cincere Deprey,CMA

## 2014-11-26 ENCOUNTER — Ambulatory Visit: Payer: Self-pay | Admitting: Cardiology

## 2014-12-17 ENCOUNTER — Telehealth (HOSPITAL_COMMUNITY): Payer: Self-pay

## 2014-12-17 NOTE — Telephone Encounter (Signed)
Encounter complete. 

## 2014-12-19 ENCOUNTER — Encounter (HOSPITAL_COMMUNITY): Payer: Self-pay

## 2014-12-22 ENCOUNTER — Ambulatory Visit: Payer: Self-pay | Admitting: Family Medicine

## 2015-01-01 ENCOUNTER — Telehealth (HOSPITAL_COMMUNITY): Payer: Self-pay

## 2015-01-01 NOTE — Telephone Encounter (Signed)
Encounter complete. 

## 2015-01-06 ENCOUNTER — Encounter (HOSPITAL_COMMUNITY): Payer: No Typology Code available for payment source

## 2015-01-08 ENCOUNTER — Encounter: Payer: Self-pay | Admitting: Physician Assistant

## 2015-01-08 ENCOUNTER — Ambulatory Visit (HOSPITAL_COMMUNITY)
Admission: RE | Admit: 2015-01-08 | Discharge: 2015-01-08 | Disposition: A | Discharge status: Home / Self Care | Payer: No Typology Code available for payment source | Source: Ambulatory Visit | Attending: Cardiology | Admitting: Cardiology

## 2015-01-08 ENCOUNTER — Telehealth: Payer: Self-pay | Admitting: Family Medicine

## 2015-01-08 ENCOUNTER — Other Ambulatory Visit: Payer: Self-pay

## 2015-01-08 DIAGNOSIS — R0789 Other chest pain: Secondary | ICD-10-CM | POA: Insufficient documentation

## 2015-01-08 DIAGNOSIS — IMO0002 Reserved for concepts with insufficient information to code with codable children: Secondary | ICD-10-CM

## 2015-01-08 DIAGNOSIS — E08319 Diabetes mellitus due to underlying condition with unspecified diabetic retinopathy without macular edema: Secondary | ICD-10-CM

## 2015-01-08 DIAGNOSIS — E1165 Type 2 diabetes mellitus with hyperglycemia: Secondary | ICD-10-CM

## 2015-01-08 DIAGNOSIS — R079 Chest pain, unspecified: Secondary | ICD-10-CM

## 2015-01-08 MED ORDER — METFORMIN HCL 1000 MG PO TABS: 1000.0000 mg | ORAL_TABLET | Freq: Two times a day (BID) | ORAL | Status: DC

## 2015-01-08 NOTE — Progress Notes (Signed)
Let him know his appt with me should be fine. Prior to coming in, I would like him to record 3-4 RESTING BPs at home so that we have some information. Prioir to that, if he has chest pain, trouble breathing, or other concerns, he should be seen immediately in our clinic or the ED if no one is available in clinic.  Thanks.  Leona SingletonMaria T Ailani Governale, MD

## 2015-01-08 NOTE — Progress Notes (Signed)
Blue Team, see note above re: Stress test results.  Please ask patient to come in to see Dr Raymondo BandKoval for ambulatory BP monitoring given his reported normal home BPs but his high clinic BPs. I would like this on the sooner side if possible, as it will give valuable information to cardiology at appt 2/4. If unable get appt with Dr Raymondo BandKoval to place ambulatory BP monitor 2-3 days before cardiology appt  (Fri, Mon, or Tues), then we can postpone it till after his cardiology appt but please still urge him to make it to the cardiology appt.  Leona SingletonMaria T Amorah Sebring, MD

## 2015-01-08 NOTE — Telephone Encounter (Signed)
Also needs refill on metaformin [facility] [place]

## 2015-01-08 NOTE — Telephone Encounter (Signed)
Questions answered in other phone note.  I have filled metformin.  Patrick SingletonMaria T Murdock Jellison, MD

## 2015-01-08 NOTE — Progress Notes (Signed)
Pt was unable to come before his cards appt.  Scheduled for 01-20-15. Jazmin Hartsell,CMA

## 2015-01-08 NOTE — Progress Notes (Signed)
[facility]. Ordered by primary care. We have not yet seen the patient before.  Pre-Test: Baseline BP 170/101. Patient denies complaints. EKG NSR with TWI III otherwise no significant changes. Tele with occasional PVC. He reports home BP running high lately on home monitoring. During Test: Attempted to walk patient on treadmill in hopes BP would remain stable and not prohibitive of test. However, first BP after initiation of exercise (cuff inflation at 2.5 minutes into exercise, resulted just after 3 minute mark) was 235/106. Given trajectory of blood pressure, he was not safe to continue this test, especially to continue exercising at a higher level. EKG without acute changes. He denied symptoms.  Post-Test: Feeling at baseline. BP 208/96 -> 167/88 -> 151/86. EKG nonacute. Occasional PVCs. No complaints. He left the stress testing lab in stable condition.  I urged him to contact his PCP today to discuss his blood pressure regimen and also determine next step regarding stress testing. I wrote down his BPs for him. I cannot order additional testing since we have not seen the patient, but if story is atypical and blood pressure remains an issue, could consider cardiac CT or Lexiscan cardiolite. Will forward to ordering provider. Cardiology consult was previously scheduled for 01/15/15.  Tokiko Diefenderfer PA-C

## 2015-01-08 NOTE — Telephone Encounter (Signed)
Pt called to report he couldn't complete his stress test because his BP was too high With exercise it went up to 235/104 after 2 1/2 minutes on the treadmill Post exercise 208/96 167/88-afterwards  Has appt wed  Does he need to be seen sooner?

## 2015-01-09 NOTE — Progress Notes (Signed)
LMOVM for pt to return call .Patrick [name] Dawn  

## 2015-01-09 NOTE — Progress Notes (Signed)
LMOVM again. Mareta Chesnut Dawn  

## 2015-01-13 ENCOUNTER — Ambulatory Visit (INDEPENDENT_AMBULATORY_CARE_PROVIDER_SITE_OTHER): Payer: No Typology Code available for payment source | Admitting: *Deleted

## 2015-01-13 ENCOUNTER — Other Ambulatory Visit: Payer: Self-pay | Admitting: *Deleted

## 2015-01-13 VITALS — BP 140/100 | HR 81

## 2015-01-13 DIAGNOSIS — Z0130 Encounter for examination of blood pressure without abnormal findings: Secondary | ICD-10-CM

## 2015-01-13 DIAGNOSIS — Z136 Encounter for screening for cardiovascular disorders: Secondary | ICD-10-CM

## 2015-01-13 DIAGNOSIS — I10 Essential (primary) hypertension: Secondary | ICD-10-CM

## 2015-01-13 DIAGNOSIS — E119 Type 2 diabetes mellitus without complications: Secondary | ICD-10-CM

## 2015-01-13 NOTE — Progress Notes (Signed)
A letter will not reach him before appointment tomorrow so if he does not call back today we will just discuss at appointment. Can you please check with Dr Raymondo BandKoval if he has any time tomorrow to come around time of patient's appointment and fit him with amb BP monitor? I will also fwd this to him.  Thanks,  Leona SingletonMaria T Taim Wurm, MD

## 2015-01-13 NOTE — Progress Notes (Signed)
LMOVM for pt to return call, will forward to MD for next step since pt has appt tomorrow. Nannie Starzyk, Maryjo RochesterJessica Dawn

## 2015-01-13 NOTE — Progress Notes (Signed)
   Pt in nurse clinic for blood pressure check.  BP 140/100 manually, heart rate 81.  Pt denies any SOB, headache, numbness/tingling in arms/legs.  Pt stated he does get dizzy from time to time when he stands up to quickly and chest pain that starts at left shoulder down to left upper breast area. Denies any dizziness or chest pain today.  Precept with Dr. Randolm IdolFletke.  Dr. Randolm IdolFletke assessed patient.  Will forward chart to PCP and Dr. Randolm IdolFletke.  Clovis PuMartin, Elynn Patteson L, RN

## 2015-01-13 NOTE — Progress Notes (Addendum)
Patient seen at request of RN staff.  Patient reports intermittent sharp chest pain, starts in left shoulder and radiates to chest, lasts only a few seconds, no current check pain however recreates symptoms when moving left shoulder, no relation to exertion, no associated sob/nausea/abdominal pain. Denies chest pressure/heaviness however did have this sensation a few days ago.   Patient was scheduled for recent stress test, this was unable to be done due to elevated blood pressure which is improved today, has scheduled follow up with PCP tomorrow and cardiology on 01/15/15.  He reports episode of dizziness last night, occurred with standing, lasted only a few seconds, not associated with chest pain, no loc  Vitals: reviewed Cardiac: RRR, S1 and S2 present, no murmurs Resp: CTAB, normal effort MSK: tenderness to palpation over left chest and shoulder  A/P: 44 y/o male with chest pain, most consistent with MSK etiology. Return precautions given. Counseled on importance of follow up with PCP and Cardiology. He also has symptoms consistent with orthostatic hypotension. Careful titration of BP meds will be needed to avoid syncope.   Donnella ShamKyle Lockie Bothun MD

## 2015-01-13 NOTE — Addendum Note (Signed)
Addended by: Uvaldo RisingFLETKE, Dasani Crear J on: 01/13/2015 05:01 PM   Modules accepted: Level of Service

## 2015-01-14 ENCOUNTER — Encounter: Payer: Self-pay | Admitting: Family Medicine

## 2015-01-14 ENCOUNTER — Ambulatory Visit (INDEPENDENT_AMBULATORY_CARE_PROVIDER_SITE_OTHER): Payer: No Typology Code available for payment source | Admitting: Family Medicine

## 2015-01-14 VITALS — BP 167/109 | HR 79 | Temp 98.3°F | Wt 247.0 lb

## 2015-01-14 DIAGNOSIS — I10 Essential (primary) hypertension: Secondary | ICD-10-CM

## 2015-01-14 DIAGNOSIS — R079 Chest pain, unspecified: Secondary | ICD-10-CM

## 2015-01-14 DIAGNOSIS — E785 Hyperlipidemia, unspecified: Secondary | ICD-10-CM

## 2015-01-14 MED ORDER — ROSUVASTATIN CALCIUM 20 MG PO TABS: 20.0000 mg | ORAL_TABLET | Freq: Every day | ORAL | Status: DC

## 2015-01-14 NOTE — Assessment & Plan Note (Signed)
Chest pain intermittently is likely MSK related to left shoulder pain. However, concern with very elevated BPs intermittently. No exertional symptoms today. Kidney function normal in Nov. BP here on recheck 150s/80s. - Ambulatory BP monitor placed by Dr Raymondo BandKoval today. Will return tomorrow for it to be read. - Urged pt to get to his cardiology appt tomorrow. - Urged him to buy BP cuff to help us monitor BP from here on out. - Start a walking routine. - Start carvedilol and aspirin. - Unable to afford atorvastatin. Realized pt has [facility]. Will send rosuvastatin to [facility]. Pt to inform us if unable to get. - Return precautions reviewed for emergency evaluation.

## 2015-01-14 NOTE — Progress Notes (Signed)
Patient ID: Patrick [name], male   DOB: 08/20/1971, 44 y.o.   MRN: [id number] Subjective:   CC: Follow up HTN  HPI:   Patient is a 44 y.o. male presenting to f/u HTN. He has had intermittent chest pain and was seen 11/20 for this. Had stress test arranged but was unable to withstand treadmill due to BP jumping to 236/106. Post test decreased to 151/86. Taking lisinopril/HCTZ but never picked up carvedilol 6.25mg  BID. Has also not started aspirin. No h/o bleeding. Has BP monitor but has not used because it does not work well. Took medications this morning. Has not started exercising. Has chronic left shoulder pain for which he took 2 ibuprofen this week (2 days ago). Still having intermittent left chest pain that is sharp and worsened with arm movement. Did however have constant pressure 2-3 days ago.     Review of Systems - Per HPI.   FH: No heart hx per pt Smoking status: Nonsmoker No drug or etoh use 2 cousins with cancer Son had cancer (hodgkins lymphoma), doing well  Objective:  Physical Exam BP 167/109 mmHg  Pulse 79  Temp(Src) 98.3 F (36.8 C) (Oral)  Wt 247 lb (112.038 kg)  Recheck 150s/80s GEN: NAD CV: RRR, distant, 2+ B radial pulses EXTR: No LE edema or calf tenderess PULM: CTAB, normal effort EXTR: Left chest reproducibility and likely related to shoulder; left shoulder tenderness    Assessment:     Patrick [name] is a 44 y.o. male here for HTN f/u.    Plan:     # See problem list and after visit summary for problem-specific plans.   Follow-up: Follow up in 2-3 weeks when available for eval of shoulder.   Leona SingletonMaria T Kortnee Bas, MD [facility]

## 2015-01-14 NOTE — Patient Instructions (Addendum)
Be sure to follow up with cardiology tomorrow at 4pm. This is very important. Bring back the monitor tomorrow to go over with Dr Raymondo BandKoval. Gerre CouchPick up the carvedilol and st art taking. I am sending in a new cholesterol med. If too expensive, please call and let me know. Start aspirin. Please buy a BP cuff. If you have constant pressure like chest pain with shortness of breath, nausea, or other concerns, go to the emergency  Room. Follow up with me within 1-2 weeks to talk about your shoulder. If I am not available you can see one of my colleagues.  Wearing the Blood Pressure Monitor  The cuff will inflate every 20 minutes during the day and every 30 minutes while you sleep.  Your blood pressure readings will NOT display after cuff inflation  Fill out the blood pressure-activity diary during the day, especially during activities that may affect your reading -- such as exercise, stress, walking, taking your blood pressure medications  Important things to know:  Avoid taking the monitor off for the next 24 hours, unless it causes you discomfort or pain.  Do NOT get the monitor wet and do NOT dry to clean the monitor with any cleaning products.  Do NOT drop the monitor.  Do NOT put the monitor on anyone else's arm.  When the cuff inflates, avoid excess movement. Let the cuffed arm hang loosely, slightly away from the body. Avoid flexing the muscles or moving the hand/fingers.  When you go to sleep, make sure that the hose is not kinked.  Remember to fill out the blood pressure activity diary.  If you experience severe pain or unusual pain (not associated with getting your blood pressure checked), remove the monitor.  Troubleshooting:  Code  Troubleshooting   1  Check cuff position, tighten cuff   2, 3  Remain still during reading   4, 87  Check air hose connections and make sure cuff is tight   85, 89  Check hose connections and make tubing is not crimped   86  Push START/STOP to  restart reading   88, 91  Retry by pushing START/STOP   90  Replace batteries. If problem persists, remove monitor and bring back to   clinic at follow up   97, 98, 99  Service required - Remove monitor and bring back to clinic at follow up    Blood Pressure Activity Diary  Time Lying down/ Sleeping Walking/ Exercise Stressed/ Angry Headache/ Pain Dizzy  9 AM       10 AM       11 AM       12 PM       1 PM       2 PM       Time Lying down/ Sleeping Walking/ Exercise Stressed/ Angry Headache/ Pain Dizzy  3 PM       4 PM        5 PM       6 PM       7 PM       8 PM       Time Lying down/ Sleeping Walking/ Exercise Stressed/ Angry Headache/ Pain Dizzy  9 PM       10 PM       11 PM       12 AM       1 AM       2 AM       3 AM  Time Lying down/ Sleeping Walking/ Exercise Stressed/ Angry Headache/ Pain Dizzy  4 AM       5 AM       6 AM       7 AM       8 AM       9 AM       10 AM        Time you woke up: _________                  Time you went to sleep:__________    Come back tomorrow at ___________ to have the monitor removed  Call the [facility] if you have any questions before then [phone])

## 2015-01-14 NOTE — Telephone Encounter (Signed)
Pt is requesting lantus refill. Please clarify what dose of lantus Mr Patrick [name] is now taking so I can clarify this on his rx. Thank you!  Patrick SingletonMaria T Lamica Mccart, MD

## 2015-01-14 NOTE — Assessment & Plan Note (Signed)
See Hypertension A/P.  - f/u when available to discuss left shoulder pain which he states has not been worked up.

## 2015-01-15 ENCOUNTER — Ambulatory Visit (INDEPENDENT_AMBULATORY_CARE_PROVIDER_SITE_OTHER): Payer: No Typology Code available for payment source | Admitting: Cardiology

## 2015-01-15 ENCOUNTER — Encounter: Payer: Self-pay | Admitting: Cardiology

## 2015-01-15 ENCOUNTER — Ambulatory Visit (INDEPENDENT_AMBULATORY_CARE_PROVIDER_SITE_OTHER): Payer: No Typology Code available for payment source | Admitting: Pharmacist

## 2015-01-15 ENCOUNTER — Encounter: Payer: Self-pay | Admitting: Pharmacist

## 2015-01-15 VITALS — BP 143/96 | Ht 68.0 in | Wt 247.1 lb

## 2015-01-15 VITALS — BP 150/89 | HR 69 | Ht 68.0 in | Wt 252.0 lb

## 2015-01-15 DIAGNOSIS — E785 Hyperlipidemia, unspecified: Secondary | ICD-10-CM

## 2015-01-15 DIAGNOSIS — I10 Essential (primary) hypertension: Secondary | ICD-10-CM

## 2015-01-15 DIAGNOSIS — E119 Type 2 diabetes mellitus without complications: Secondary | ICD-10-CM

## 2015-01-15 DIAGNOSIS — R079 Chest pain, unspecified: Secondary | ICD-10-CM

## 2015-01-15 MED ORDER — ROSUVASTATIN CALCIUM 20 MG PO TABS: 20.0000 mg | ORAL_TABLET | Freq: Every day | ORAL | Status: DC

## 2015-01-15 MED ORDER — LISINOPRIL-HYDROCHLOROTHIAZIDE 20-25 MG PO TABS: 1.0000 | ORAL_TABLET | Freq: Every day | ORAL | Status: DC

## 2015-01-15 MED ORDER — LISINOPRIL-HYDROCHLOROTHIAZIDE 20-12.5 MG PO TABS: 1.0000 | ORAL_TABLET | Freq: Every day | ORAL | Status: DC

## 2015-01-15 NOTE — Patient Instructions (Addendum)
Your physician has recommended you make the following change in your medication:  1) INCREASE lisinopril/HCTZ to 20/25 mg daily.  Your physician recommends that you return for lab work in [employer] for [employer].  Your physician has requested that you have an echocardiogram. Echocardiography is a painless test that uses sound waves to create images of your heart. It provides your doctor with information about the size and shape of your heart and how well your heart's chambers and valves are working. This procedure takes approximately one hour. There are no restrictions for this procedure.  Your physician has requested that you have a lexiscan myoview. For further information please visit [email or web address]. Please follow instruction sheet, as given.  Your physician recommends that you schedule a follow-up appointment with a PA or NP in 2 weeks.  Your physician recommends that you schedule a follow-up appointment AS NEEDED with Dr. Mayford Knifeurner pending tests.

## 2015-01-15 NOTE — Telephone Encounter (Signed)
Pt states that he is taking 20 units of lantus daily.  Also he states that the medication you called into [facility] was $200.  He would like something else called in.  He has his appt this morning with Dr. Raymondo BandKoval to return BP cuff. Ife Vitelli,CMA

## 2015-01-15 NOTE — Progress Notes (Signed)
Cardiology Office Note   Date:  01/15/2015   ID:  Patrick SeenHerman Kratky, DOB 09/16/1971, MRN [id number]  PCP:  Simone Curiahekkekandam, Maria, MD  Cardiologist:   Quintella ReichertURNER,TRACI R, MD   Chief Complaint  Patient presents with  . Chest Pain      History of Present Illness: Patrick [name] is a 44 y.o. male who presents for evaluation of chest pain. He has been having CP since November which is intermittent and nonexertional and random in occurrence.  It is left sided and radiates to his shoulder and is described as a sharp pain with some pressure and squeezing sensation.  It usually lasts about 1 minutes and resolves with movement.  There is no associated nausea, diaphoresis or SOB.  The sharp pains once weekly but the pressure CP occurs a few times weekly. He denies any DOE.   He also has a history of HTN and DM and is on metformin and glipizide.  He had an ETT done in January which was nondiagnostic due to inability to reach target HR due to early termination of study due to severe hypertensive BP response to exercise.  Telemetry was noted to have PVC's.  His BP got as high as 235/18806mmHg.  He is now for further evaluation of CP.  He denies any LE edema,  palpitations or syncope.  He occasioanlly has some dizziness when going from sitting to standing too fast.     Past Medical History  Diagnosis Date  . Diabetes mellitus without complication   . Hyperlipidemia   . Hypertension   . Obesity     Past Surgical History  Procedure Laterality Date  . Cholecystectomy  2000     Current Outpatient Prescriptions  Medication Sig Dispense Refill  . aspirin EC 81 MG tablet Take 1 tablet (81 mg total) by mouth daily. 90 tablet 3  . carvedilol (COREG) 6.25 MG tablet Take 1 tablet (6.25 mg total) by mouth 2 (two) times daily with a meal. 60 tablet 3  . glipiZIDE (GLUCOTROL) 10 MG tablet Take 1 tablet (10 mg total) by mouth 2 (two) times daily before a meal. Please follow up this month. 180 tablet 0  . glucose  blood (WAVESENSE PRESTO) test strip Use as instructed - Dispense QS for up to twice daily testing. 50 each 12  . insulin glargine (LANTUS) 100 UNIT/ML injection Inject 20 Units into the skin every morning.     Marland Kitchen. lisinopril-hydrochlorothiazide (PRINZIDE,ZESTORETIC) 10-12.5 MG per tablet TAKE ONE TABLET BY MOUTH ONCE DAILY (OVERDUE FOR MD FOLLOW UP) 30 tablet 0  . lisinopril-hydrochlorothiazide (ZESTORETIC) 20-12.5 MG per tablet Take 1 tablet by mouth daily. 90 tablet 3  . metFORMIN (GLUCOPHAGE) 1000 MG tablet Take 1 tablet (1,000 mg total) by mouth 2 (two) times daily with a meal. 60 tablet 2  . rosuvastatin (CRESTOR) 20 MG tablet Take 1 tablet (20 mg total) by mouth daily. 30 tablet 11   No current facility-administered medications for this visit.    Allergies:   Review of patient's allergies indicates no known allergies.    Social History:  The patient  reports that he has never smoked. He has never used smokeless tobacco. He reports that he does not drink alcohol or use illicit drugs.   Family History:  The patient's family history includes Cancer in his maternal aunt, maternal grandmother, mother, and son; Diabetes in his mother and sister; Hypertension in his mother. There is no history of Heart attack or Stroke.    ROS:  Please  see the history of present illness.   Otherwise, review of systems are positive for none.   All other systems are reviewed and negative.    PHYSICAL EXAM: VS:  BP 150/89 mmHg  Pulse 69  Ht  (1.727 m)  Wt 252 lb (114.306 kg)  BMI 38.33 kg/m2 , BMI Body mass index is 38.33 kg/(m^2). GEN: Well nourished, well developed, in no acute distress HEENT: normal Neck: no JVD, carotid bruits, or masses Cardiac: RRR; no murmurs, rubs, or gallops,no edema  Respiratory:  clear to auscultation bilaterally, normal work of breathing GI: soft, nontender, nondistended, + BS MS: no deformity or atrophy Skin: warm and dry, no rash Neuro:  Strength and sensation are  intact Psych: euthymic mood, full affect   EKG:  EKG was ordered today and showed NSR with PVC's.  Cannot rule out anterior infarct    Recent Labs: 10/31/2014: BUN 12; Creatinine 0.90; Potassium 4.5; Sodium 135    Lipid Panel    Component Value Date/Time   CHOL 151 10/31/2014 1540   TRIG 361* 10/31/2014 1540   HDL 21* 10/31/2014 1540   CHOLHDL 7.2 10/31/2014 1540   VLDL 72* 10/31/2014 1540   LDLCALC 58 10/31/2014 1540      Wt Readings from Last 3 Encounters:  01/15/15 252 lb (114.306 kg)  01/15/15 247 lb 1.6 oz (112.084 kg)  01/14/15 247 lb (112.038 kg)      Other studies Reviewed: Additional studies/ records that were reviewed today include: ETT. Review of the above records demonstrates: nondiagnostic and terminated early due to hypertensive BP response   ASSESSMENT AND PLAN:  1.  Chest pain with several cardiac risk factors including male sex, HTN, obesity and DM.  WIll set up for Lexiscan myoview to rule out ischemia and 2D echo to assess LVF and for LVH given significant HTN 2.  HTN with borderline control.  Continue Coreg.  Increase Lisinopril HCT to 20-25mg  daily.  I will have him followup with PA in 2 weeks to reevaluate 3.  Type II DM 4.  Dyslipidemia on crestor with last LDL at goal at 58   Current medicines are reviewed at length with the patient today.  The patient does not have concerns regarding medicines.  The following changes have been made:  no change  Labs/ tests ordered today include: Lexiscan myoview, 2D echo    Disposition:   FU with PA in 2 weeks for BP check and with me PRN pending results of studies   Signed, Quintella Reichert, MD  01/15/2015 4:32 PM    [facility] [address] [place], [place], [place]  [address] Phone: [phone]; Fax: [phone]

## 2015-01-15 NOTE — Progress Notes (Signed)
S:    Patient arrives alone and in good spirits. He presents to the clinic for ambulatory blood pressure evaluation.  He has reported symptoms of light-headedness outside the office.   He also reports that his old home BP monitor always reported low readings.  Diagnosed with hypertension within the last 5 years.  Reported medication compliance is good with lisinopril-HCTZ, although patient has not yet picked up his carvedilol.  Discussed procedure for wearing the monitor and gave patient written instructions. Monitor was placed on dominant arm due to shoulder pain on his non-dominant side. Patient was given instructions to return in the morning.   Current BP Medications include:  Lisinopril-HCTZ 10-12.5mg  daily  Patient returned to the clinic and reported no issues with the device.  O:   Last 3 Office BP readings: BP Readings from Last 3 Encounters:  01/15/15 143/96  01/14/15 167/109  01/13/15 140/100    Today's Office BP reading: 143/96 mmHg  ABPM Study Data: Arm Placement right arm   Overall Mean 24hr BP:   142/79 mmHg HR: 77   Daytime Mean BP:  148/82 mmHg HR: 79   Nighttime Mean BP:  121/66 mmHg HR: 67   Dipping Pattern: Yes.    Sys:   18.2%   Dia: 19.6%   [normal dipping ~10-20%]   Non-hypertensive ABPM thresholds: daytime BP <135/85 mmHg, sleeptime BP <120/70 mmHg NICE Hypertension Guidelines ([place]) using ABPM: Stage I: >135/85 mmHg, Stage 2: >150/95 mmHg)   BMET    Component Value Date/Time   NA 135 10/31/2014 1540   K 4.5 10/31/2014 1540   CL 98 10/31/2014 1540   CO2 25 10/31/2014 1540   GLUCOSE 155* 10/31/2014 1540   BUN 12 10/31/2014 1540   CREATININE 0.90 10/31/2014 1540   CREATININE 1.28 07/30/2010 2256   CALCIUM 9.8 10/31/2014 1540   CALCIUM 10.1 08/26/2010 2328    A/P: History of hypertension diagnosed within the last 5 years. Patient was given 24-hour ambulatory blood pressure demonstrating isolated systolic hypertension with an average blood  pressure of 142/79 mmHg, and a nocturnal dipping pattern that is normal (18-19%).  Changes to medications include increasing lisinopril-HCTZ. Patient instructed to take 2 tabs daily of his lisinopril-HCTZ 10-12.5mg  until he runs out. New rx sent for lisinopril-HCTZ 20-12.5mg  once patient finishes his current supply. He has been instructed to pick up his carvedilol at the pharmacy. He has a cardiology appointment this afternoon with Dr. Mayford Knifeurner.    Diabetes of 8-9 years duration in a patient who is unable to check home CBGs due to lack of strips.  He has multiple A1C readings greater than goal since the time of his diagnosis.   Using Lantus 20 units daily, Metformin 1000mg  BID and Glipizide 10mg  BID.  Plans to pick up blood glucose test strips today.   Follow up with Dr. Benjamin Stainhekkekandam in 1-2 weeks.   If he remains suboptimally controlled at that time consider initiation with GLP-1 to be obtained through [facility].    Pharmacy clinic can be used to assist if necessary.     Results reviewed and written information provided.   F/u pharmacy clinic visit with Dr. Raymondo BandKoval in 4 weeks. Total time in face-to-face counseling 30 minutes.  Patient seen with Margaretmary DysMegan Supple, PharmD resident.

## 2015-01-15 NOTE — Patient Instructions (Addendum)
It was nice to see you today in clinic.  -Please start taking 2 tablets daily of your lisinopril/hydrochlorothiazide (Zestoretic). We will send over a new prescription for a higher dose that you will start taking after you finish with your current supply - Please pick up your Crestor for your cholesterol and your diabetes testing supplies from [facility] - [facility] - Check your blood sugar once a day and record it in your log book - Schedule a follow up visit with Dr. Raymondo BandKoval in pharmacy in 3-4 weeks

## 2015-01-15 NOTE — Assessment & Plan Note (Signed)
History of hypertension diagnosed within the last 5 years. Patient was given 24-hour ambulatory blood pressure demonstrating isolated systolic hypertension with an average blood pressure of 142/79 mmHg, and a nocturnal dipping pattern that is normal (18-19%).  Changes to medications include increasing lisinopril-HCTZ. Patient instructed to take 2 tabs daily of his lisinopril-HCTZ 10-12.5mg  until he runs out. New rx sent for lisinopril-HCTZ 20-12.5mg  once patient finishes his current supply. He has been instructed to pick up his carvedilol at the pharmacy. He has a cardiology appointment this afternoon with Dr. Mayford Knifeurner.

## 2015-01-15 NOTE — Assessment & Plan Note (Signed)
Refaxed Rosuvastatin to [facility] - encouraged patient to complete paperwork for obtaining this medication through [facility].

## 2015-01-15 NOTE — Assessment & Plan Note (Signed)
Diabetes of 8-9 years duration in a patient who is unable to check home CBGs due to lack of strips.  He has multiple A1C readings greater than goal since the time of his diagnosis.   Using Lantus 20 units daily, Metformin 1000mg  BID and Glipizide 10mg  BID.  Plans to pick up blood glucose test strips today.   Follow up with Dr. Benjamin Stainhekkekandam in 1-2 weeks.   If he remains suboptimally controlled at that time consider initiation with GLP-1 to be obtained through [facility].    Pharmacy clinic can be used to assist if necessary.

## 2015-01-16 MED ORDER — INSULIN GLARGINE 100 UNIT/ML ~~LOC~~ SOLN: SUBCUTANEOUS | Status: DC

## 2015-01-16 NOTE — Telephone Encounter (Signed)
Refilled lantus.  I thought I had sent his rosuvastatin to [facility]. In any case, I think Dr Raymondo BandKoval resent it.  Patrick SingletonMaria T Saniyyah Elster, MD

## 2015-01-20 ENCOUNTER — Ambulatory Visit: Payer: No Typology Code available for payment source | Admitting: Pharmacist

## 2015-01-20 NOTE — Progress Notes (Signed)
Patient ID: Orvan SeenHerman [name], male   DOB: 12/30/1970, [age] y.o.   MRN: [id number] Reviewed: Agree with Dr. Macky LowerKoval's documentation and management

## 2015-01-23 ENCOUNTER — Other Ambulatory Visit (INDEPENDENT_AMBULATORY_CARE_PROVIDER_SITE_OTHER): Payer: No Typology Code available for payment source | Admitting: *Deleted

## 2015-01-23 ENCOUNTER — Ambulatory Visit (HOSPITAL_BASED_OUTPATIENT_CLINIC_OR_DEPARTMENT_OTHER): Payer: No Typology Code available for payment source | Admitting: Radiology

## 2015-01-23 ENCOUNTER — Ambulatory Visit (HOSPITAL_COMMUNITY): Payer: No Typology Code available for payment source | Attending: Cardiovascular Disease | Admitting: Cardiology

## 2015-01-23 ENCOUNTER — Encounter (HOSPITAL_COMMUNITY): Payer: Self-pay | Admitting: Radiology

## 2015-01-23 DIAGNOSIS — I10 Essential (primary) hypertension: Secondary | ICD-10-CM

## 2015-01-23 DIAGNOSIS — R079 Chest pain, unspecified: Secondary | ICD-10-CM

## 2015-01-23 LAB — BASIC METABOLIC PANEL
BUN: 14 mg/dL (ref 6–23)
CALCIUM: 9.5 mg/dL (ref 8.4–10.5)
CO2: 30 mEq/L (ref 19–32)
CREATININE: 1 mg/dL (ref 0.40–1.50)
Chloride: 101 mEq/L (ref 96–112)
GFR: 104.03 mL/min (ref >60.00)
GLUCOSE: 122 mg/dL — AB (ref 70–99)
Potassium: 3.7 mEq/L (ref 3.5–5.1)
Sodium: 137 mEq/L (ref 135–145)

## 2015-01-23 MED ORDER — REGADENOSON 0.4 MG/5ML IV SOLN
0.4000 mg | Freq: Once | INTRAVENOUS | Status: AC
  Administered 2015-01-23: 0.4 mg via INTRAVENOUS

## 2015-01-23 MED ORDER — TECHNETIUM TC 99M SESTAMIBI GENERIC - CARDIOLITE
33.0000 | Freq: Once | INTRAVENOUS | Status: AC | PRN
  Administered 2015-01-23: 33 via INTRAVENOUS

## 2015-01-23 MED ORDER — TECHNETIUM TC 99M SESTAMIBI GENERIC - CARDIOLITE
11.0000 | Freq: Once | INTRAVENOUS | Status: AC | PRN
  Administered 2015-01-23: 11 via INTRAVENOUS

## 2015-01-23 NOTE — Progress Notes (Signed)
Echo performed. 

## 2015-01-23 NOTE — Progress Notes (Signed)
[facility] SITE 3 NUCLEAR MED [address] [place], [place] [address] [phone]    Cardiology Nuclear Med Study  Patrick SeenHerman Routon is a 44 y.o. male     MRN : [id number]     DOB: 02/16/1971  Procedure Date: 01/23/2015  Nuclear Med Background Indication for Stress Test:  Evaluation for Ischemia History:  No known CAD Cardiac Risk Factors: Hypertension and IDDM Type 1  Symptoms:  Chest Pain (last date of chest discomfort 01/19/2015)   Nuclear Pre-Procedure Caffeine/Decaff Intake:  None NPO After: 10:00pm   Lungs:  clear O2 Sat: 97% on room air. IV 0.9% NS with Angio Cath:  22g  IV Site: R Antecubital  IV Started by:  Kerby NoraElzbieta Kubak, CNMT  Chest Size (in):  52 Cup Size: n/a  Height:    Weight:  250 lb (113.399 kg)  BMI:  Body mass index is 38.02 kg/(m^2). Tech Comments:  NA    Nuclear Med Study 1 or 2 day study: 1 day  Stress Test Type:  Eugenie BirksLexiscan  Reading MD: Marca Anconaalton McLean, MD  Order Authorizing Provider:  T. Turner, MD  Resting Radionuclide: Technetium 1966m Sestamibi  Resting Radionuclide Dose: 11.0 mCi   Stress Radionuclide:  Technetium 5466m Sestamibi  Stress Radionuclide Dose: 33.0 mCi           Stress Protocol Rest HR: 71 Stress HR: 102  Rest BP: 149/84 Stress BP: 143/104  Exercise Time (min): n/a METS: n/a   Predicted Max HR: 176 bpm % Max HR: 57.95 bpm Rate Pressure Product: [id number]   Dose of Adenosine (mg):  n/a Dose of Lexiscan: 0.4 mg  Dose of Atropine (mg): n/a Dose of Dobutamine: n/a mcg/kg/min (at max HR)  Stress Test Technologist: Nelson ChimesSharon Brooks, BS-ES  Nuclear Technologist:  Kerby NoraElzbieta Kubak, CNMT     Rest Procedure:  Myocardial perfusion imaging was performed at rest 45 minutes following the intravenous administration of Technetium 4766m Sestamibi. Rest ECG: NSR - Normal EKG  Stress Procedure:  The patient received IV Lexiscan 0.4 mg over 15-seconds.  Technetium 7266m Sestamibi injected at 30-seconds.  Quantitative spect images were obtained after a  45 minute delay.  During the infusion of Lexiscan the patient complained of SOB and lightheadness that resolved in recovery.  Stress ECG: No significant change from baseline ECG  QPS Raw Data Images:  Normal; no motion artifact; normal heart/lung ratio. Stress Images:  Normal homogeneous uptake in all areas of the myocardium. Rest Images:  Normal homogeneous uptake in all areas of the myocardium. Subtraction (SDS):  No evidence of ischemia. Transient Ischemic Dilatation (Normal <1.22):  1.03 Lung/Heart Ratio (Normal <0.45):  0.26  Quantitative Gated Spect Images QGS EDV:  127 ml QGS ESV:  50 ml  Impression Exercise Capacity:  Lexiscan with no exercise. BP Response:  Normal blood pressure response. Clinical Symptoms:  No chest pain. ECG Impression:  No significant ST segment change suggestive of ischemia. Comparison with Prior Nuclear Study: No images to compare  Overall Impression:  Normal stress nuclear study.  LV Ejection Fraction: 61%.  LV Wall Motion:  NL LV Function; NL Wall Motion  Cassell Clementhomas Channing Savich MD

## 2015-01-29 ENCOUNTER — Other Ambulatory Visit: Payer: Self-pay | Admitting: *Deleted

## 2015-01-29 DIAGNOSIS — E119 Type 2 diabetes mellitus without complications: Secondary | ICD-10-CM

## 2015-01-30 ENCOUNTER — Ambulatory Visit: Payer: No Typology Code available for payment source | Admitting: Physician Assistant

## 2015-01-30 MED ORDER — INSULIN GLARGINE 100 UNIT/ML ~~LOC~~ SOLN: SUBCUTANEOUS | Status: DC

## 2015-02-04 ENCOUNTER — Ambulatory Visit: Payer: No Typology Code available for payment source | Admitting: Physician Assistant

## 2015-02-12 ENCOUNTER — Encounter (HOSPITAL_COMMUNITY): Payer: Self-pay | Admitting: Emergency Medicine

## 2015-02-12 ENCOUNTER — Encounter: Payer: No Typology Code available for payment source | Admitting: Physician Assistant

## 2015-02-12 ENCOUNTER — Emergency Department (HOSPITAL_COMMUNITY)
Admission: EM | Admit: 2015-02-12 | Discharge: 2015-02-12 | Disposition: A | Discharge status: Home / Self Care | Payer: No Typology Code available for payment source | Attending: Emergency Medicine | Admitting: Emergency Medicine

## 2015-02-12 DIAGNOSIS — I10 Essential (primary) hypertension: Secondary | ICD-10-CM | POA: Insufficient documentation

## 2015-02-12 DIAGNOSIS — K088 Other specified disorders of teeth and supporting structures: Secondary | ICD-10-CM | POA: Insufficient documentation

## 2015-02-12 DIAGNOSIS — Z794 Long term (current) use of insulin: Secondary | ICD-10-CM | POA: Insufficient documentation

## 2015-02-12 DIAGNOSIS — K0889 Other specified disorders of teeth and supporting structures: Secondary | ICD-10-CM

## 2015-02-12 DIAGNOSIS — Z79899 Other long term (current) drug therapy: Secondary | ICD-10-CM | POA: Insufficient documentation

## 2015-02-12 DIAGNOSIS — E785 Hyperlipidemia, unspecified: Secondary | ICD-10-CM | POA: Insufficient documentation

## 2015-02-12 DIAGNOSIS — E119 Type 2 diabetes mellitus without complications: Secondary | ICD-10-CM | POA: Insufficient documentation

## 2015-02-12 DIAGNOSIS — E669 Obesity, unspecified: Secondary | ICD-10-CM | POA: Insufficient documentation

## 2015-02-12 DIAGNOSIS — Z7982 Long term (current) use of aspirin: Secondary | ICD-10-CM | POA: Insufficient documentation

## 2015-02-12 MED ORDER — PENICILLIN V POTASSIUM 500 MG PO TABS: 500.0000 mg | ORAL_TABLET | Freq: Four times a day (QID) | ORAL | Status: AC

## 2015-02-12 MED ORDER — HYDROCODONE-ACETAMINOPHEN 5-325 MG PO TABS
1.0000 | ORAL_TABLET | Freq: Once | ORAL | Status: AC
  Administered 2015-02-12: 1 via ORAL
  Filled 2015-02-12: qty 1

## 2015-02-12 MED ORDER — HYDROCODONE-ACETAMINOPHEN 5-325 MG PO TABS: 1.0000 | ORAL_TABLET | Freq: Four times a day (QID) | ORAL | Status: DC | PRN

## 2015-02-12 NOTE — ED Notes (Addendum)
Patient coming from home with lower left tooth pain ongoing x 2 days.

## 2015-02-12 NOTE — Progress Notes (Deleted)
Cardiology Office Note   Date:  02/12/2015   ID:  Patrick [name], DOB 03-13-1971, MRN [id number]  PCP:  Simone Curia, MD  Cardiologist:  Dr. Armanda Magic     Chief Complaint  Patient presents with  . Follow-up    Hypertension, Stress test     History of Present Illness: Patrick [name] is a 44 y.o. male with a hx of HTN, HL, DM2.  Evaluated by Dr. Armanda Magic 01/15/15 for chest pain.  Myoview was done and this was normal.  Echo demonstrated normal LVF and normal LV thickness.  Medications were adjusted for BP.  He returns for FU.  ***   Studies/Reports Reviewed Today:  Myoview 01/26/15 No ischemia, EF 61%; Normal  Echocardiogram 01/23/15 EF 60-65%   Past Medical History  Diagnosis Date  . Diabetes mellitus without complication   . Hyperlipidemia   . Hypertension   . Obesity     Past Surgical History  Procedure Laterality Date  . Cholecystectomy  2000     Current Outpatient Prescriptions  Medication Sig Dispense Refill  . aspirin EC 81 MG tablet Take 1 tablet (81 mg total) by mouth daily. 90 tablet 3  . carvedilol (COREG) 6.25 MG tablet Take 1 tablet (6.25 mg total) by mouth 2 (two) times daily with a meal. 60 tablet 3  . glipiZIDE (GLUCOTROL) 10 MG tablet Take 1 tablet (10 mg total) by mouth 2 (two) times daily before a meal. Please follow up this month. 180 tablet 0  . glucose blood (WAVESENSE PRESTO) test strip Use as instructed - Dispense QS for up to twice daily testing. 50 each 12  . HYDROcodone-acetaminophen (NORCO/VICODIN) 5-325 MG per tablet Take 1 tablet by mouth every 6 (six) hours as needed for moderate pain. 20 tablet 0  . insulin glargine (LANTUS) 100 UNIT/ML injection Inject 20 units daily. Due for primary doctor follow up. 10 mL 5  . lisinopril-hydrochlorothiazide (PRINZIDE,ZESTORETIC) 20-25 MG per tablet Take 1 tablet by mouth daily. 30 tablet 6  . metFORMIN (GLUCOPHAGE) 1000 MG tablet Take 1 tablet (1,000 mg total) by mouth 2 (two) times daily  with a meal. 60 tablet 2  . penicillin v potassium (VEETID) 500 MG tablet Take 1 tablet (500 mg total) by mouth 4 (four) times daily. 40 tablet 0  . rosuvastatin (CRESTOR) 20 MG tablet Take 1 tablet (20 mg total) by mouth daily. 30 tablet 11   No current facility-administered medications for this visit.    Allergies:   Review of patient's allergies indicates no known allergies.    Social History:  The patient  reports that he has never smoked. He has never used smokeless tobacco. He reports that he does not drink alcohol or use illicit drugs.   Family History:  The patient's ***family history includes Cancer in his maternal aunt, maternal grandmother, mother, and son; Diabetes in his mother and sister; Hypertension in his mother. There is no history of Heart attack or Stroke.    ROS:   Please see the history of present illness.   ROS    PHYSICAL EXAM: VS:  There were no vitals taken for this visit.    Wt Readings from Last 3 Encounters:  02/12/15 250 lb (113.399 kg)  01/23/15 250 lb (113.399 kg)  01/15/15 252 lb (114.306 kg)     GEN: Well nourished, well developed, in no acute distress HEENT: normal Neck: *** JVD, ***carotid bruits, no masses Cardiac:  Normal S1/S2, ***RRR; *** murmur ***, *** no rubs or  gallops, {NUMBERS; 1+ TO 4+, TRACE/RARE:14493} edema  Respiratory:  ***clear to auscultation bilaterally, no wheezing, rhonchi or rales. GI: ***soft, nontender, nondistended, + BS MS: no deformity or atrophy Skin: warm and dry  Neuro:  CNs II-XII intact, Strength and sensation are intact Psych: Normal affect   EKG:  EKG {ACTION; IS/IS [id number]:[id number]}OT:21021397} ordered today.  It demonstrates:   ***   Recent Labs: 01/23/2015: BUN 14; Creatinine 1.00; Potassium 3.7; Sodium 137    Lipid Panel    Component Value Date/Time   CHOL 151 10/31/2014 1540   TRIG 361* 10/31/2014 1540   HDL 21* 10/31/2014 1540   CHOLHDL 7.2 10/31/2014 1540   VLDL 72* 10/31/2014 1540   LDLCALC 58  10/31/2014 1540      ASSESSMENT AND PLAN:  1.  HYPERTENSION, BENIGN ESSENTIAL:  *** 2.  Chest pain, unspecified chest pain type:  *** 3.  Hyperlipidemia:  *** 4.  Diabetes mellitus without complication:  ***    Current medicines are reviewed at length with the patient today.  The patient {ACTIONS; HAS/DOES NOT HAVE:19233} concerns regarding medicines.  The following changes have been made:  {PLAN; NO CHANGE:13088:s}  Labs/ tests ordered today include: *** No orders of the defined types were placed in this encounter.    Disposition:   FU with *** in {gen number 6-57:846962}0-10:310397} {TIME; UNITS DAY/WEEK/MONTH:19136}   Signed, Brynda RimScott Lerone Onder, PA-C, MHS 02/12/2015 7:50 AM    [facility] [address], [place], [place]  [address] Phone: [phone]; Fax: [phone]

## 2015-02-12 NOTE — Discharge Instructions (Signed)
Follow up with a dentist in one week. °

## 2015-02-12 NOTE — ED Notes (Signed)
Escorted to the waiting room with this RN.  Stable ambulation.  Patient to call cousin to have him come be picked up.  Patient verbalized understanding of discharge instructions and the need for follow up care.

## 2015-02-12 NOTE — ED Provider Notes (Signed)
CSN: [id number]     Arrival date & time 02/12/15  0714 History   First MD Initiated Contact with Patient 02/12/15 [phone]     Chief Complaint  Patient presents with  . Facial Pain     (Consider location/radiation/quality/duration/timing/severity/associated sxs/prior Treatment) Patient is a 44 y.o. male presenting with tooth pain. The history is provided by the patient (the pt complains of a toothache).  Dental Pain Location:  Lower Lower teeth location: lower left molar. Quality:  Pulsating Severity:  Moderate Onset quality:  Gradual Timing:  Constant Progression:  Waxing and waning Chronicity:  New Context: not abscess   Associated symptoms: no congestion and no headaches     Past Medical History  Diagnosis Date  . Diabetes mellitus without complication   . Hyperlipidemia   . Hypertension   . Obesity    Past Surgical History  Procedure Laterality Date  . Cholecystectomy  2000   Family History  Problem Relation Age of Onset  . Cancer Mother   . Diabetes Mother   . Hypertension Mother   . Diabetes Sister   . Cancer Maternal Grandmother   . Cancer Son   . Cancer Maternal Aunt   . Heart attack Neg Hx   . Stroke Neg Hx    History  Substance Use Topics  . Smoking status: Never Smoker   . Smokeless tobacco: Never Used  . Alcohol Use: No     Comment: 2014 quit, was drinking 2  beers daily at that time    Review of Systems  Constitutional: Negative for appetite change and fatigue.  HENT: Negative for congestion, ear discharge and sinus pressure.        Left toothache  Eyes: Negative for discharge.  Respiratory: Negative for cough.   Cardiovascular: Negative for chest pain.  Gastrointestinal: Negative for abdominal pain and diarrhea.  Genitourinary: Negative for frequency and hematuria.  Musculoskeletal: Negative for back pain.  Skin: Negative for rash.  Neurological: Negative for seizures and headaches.  Psychiatric/Behavioral: Negative for hallucinations.       Allergies  Review of patient's allergies indicates no known allergies.  Home Medications   Prior to Admission medications   Medication Sig Start Date End Date Taking? Authorizing Provider  aspirin EC 81 MG tablet Take 1 tablet (81 mg total) by mouth daily. 10/31/14   Leona Singleton, MD  carvedilol (COREG) 6.25 MG tablet Take 1 tablet (6.25 mg total) by mouth 2 (two) times daily with a meal. 10/31/14   Leona Singleton, MD  glipiZIDE (GLUCOTROL) 10 MG tablet Take 1 tablet (10 mg total) by mouth 2 (two) times daily before a meal. Please follow up this month. 11/25/14   Leona Singleton, MD  glucose blood (WAVESENSE PRESTO) test strip Use as instructed - Dispense QS for up to twice daily testing. 01/02/14   Sanjuana Letters, MD  HYDROcodone-acetaminophen (NORCO/VICODIN) 5-325 MG per tablet Take 1 tablet by mouth every 6 (six) hours as needed for moderate pain. 02/12/15   Benny Lennert, MD  insulin glargine (LANTUS) 100 UNIT/ML injection Inject 20 units daily. Due for primary doctor follow up. 01/30/15   Leona Singleton, MD  lisinopril-hydrochlorothiazide (PRINZIDE,ZESTORETIC) 20-25 MG per tablet Take 1 tablet by mouth daily. 01/15/15   Quintella Reichert, MD  metFORMIN (GLUCOPHAGE) 1000 MG tablet Take 1 tablet (1,000 mg total) by mouth 2 (two) times daily with a meal. 01/08/15   Leona Singleton, MD  penicillin v potassium (VEETID) 500 MG tablet Take  1 tablet (500 mg total) by mouth 4 (four) times daily. 02/12/15 02/19/15  Benny LennertJoseph L Tequlia Gonsalves, MD  rosuvastatin (CRESTOR) 20 MG tablet Take 1 tablet (20 mg total) by mouth daily. 01/15/15   Sanjuana LettersWilliam Arthur Hensel, MD   BP 176/102 mmHg  Pulse 88  Temp(Src) 98.8 F (37.1 C) (Oral)  Resp 17  Ht 5\' 9"  (1.753 m)  Wt 250 lb (113.399 kg)  BMI 36.90 kg/m2  SpO2 98% Physical Exam  Constitutional: He is oriented to person, place, and time. He appears well-developed.  HENT:  Head: Normocephalic.  Tender impacted left lower molar   Eyes: Conjunctivae are normal.  Neck: No tracheal deviation present.  Cardiovascular:  No murmur heard. Musculoskeletal: Normal range of motion.  Neurological: He is oriented to person, place, and time.  Skin: Skin is warm.  Psychiatric: He has a normal mood and affect.    ED Course  Procedures (including critical care time) Labs Review Labs Reviewed - No data to display  Imaging Review No results found.   EKG Interpretation None      MDM   Final diagnoses:  Toothache   Toothache,   tx with pen and vicodine    Benny LennertJoseph L Kaspian Muccio, MD 02/12/15 [phone]

## 2015-02-12 NOTE — ED Notes (Signed)
Dr. Zammit at the bedside 

## 2015-02-16 NOTE — Progress Notes (Signed)
This encounter was created in error - please disregard.

## 2015-02-20 ENCOUNTER — Other Ambulatory Visit: Payer: Self-pay | Admitting: *Deleted

## 2015-02-20 DIAGNOSIS — E119 Type 2 diabetes mellitus without complications: Secondary | ICD-10-CM

## 2015-02-22 MED ORDER — INSULIN GLARGINE 100 UNIT/ML ~~LOC~~ SOLN: SUBCUTANEOUS | Status: DC

## 2015-02-24 ENCOUNTER — Other Ambulatory Visit: Payer: Self-pay | Admitting: Family Medicine

## 2015-03-23 ENCOUNTER — Telehealth: Payer: Self-pay | Admitting: Family Medicine

## 2015-03-23 NOTE — Telephone Encounter (Signed)
Patient is overdue for diabetes follow up. Will send letter stating this. At f/u, will need to re-evaluate control. Once better controlled, will likely stop glipizide due to being on insulin, to avoid hypoglycemia.  Patrick SingletonMaria T Kota Ciancio, MD

## 2015-04-12 ENCOUNTER — Other Ambulatory Visit: Payer: Self-pay | Admitting: Family Medicine

## 2015-05-10 ENCOUNTER — Other Ambulatory Visit: Payer: Self-pay | Admitting: Family Medicine

## 2015-05-23 ENCOUNTER — Other Ambulatory Visit: Payer: Self-pay | Admitting: Family Medicine

## 2015-06-04 ENCOUNTER — Encounter (HOSPITAL_COMMUNITY): Payer: Self-pay | Admitting: *Deleted

## 2015-06-04 ENCOUNTER — Emergency Department (HOSPITAL_COMMUNITY)
Admission: EM | Admit: 2015-06-04 | Discharge: 2015-06-04 | Disposition: A | Discharge status: Home / Self Care | Payer: No Typology Code available for payment source | Attending: Emergency Medicine | Admitting: Emergency Medicine

## 2015-06-04 DIAGNOSIS — Z7982 Long term (current) use of aspirin: Secondary | ICD-10-CM | POA: Insufficient documentation

## 2015-06-04 DIAGNOSIS — Z794 Long term (current) use of insulin: Secondary | ICD-10-CM | POA: Insufficient documentation

## 2015-06-04 DIAGNOSIS — E785 Hyperlipidemia, unspecified: Secondary | ICD-10-CM | POA: Insufficient documentation

## 2015-06-04 DIAGNOSIS — K0889 Other specified disorders of teeth and supporting structures: Secondary | ICD-10-CM

## 2015-06-04 DIAGNOSIS — E669 Obesity, unspecified: Secondary | ICD-10-CM | POA: Insufficient documentation

## 2015-06-04 DIAGNOSIS — I10 Essential (primary) hypertension: Secondary | ICD-10-CM | POA: Insufficient documentation

## 2015-06-04 DIAGNOSIS — E119 Type 2 diabetes mellitus without complications: Secondary | ICD-10-CM | POA: Insufficient documentation

## 2015-06-04 DIAGNOSIS — Z79899 Other long term (current) drug therapy: Secondary | ICD-10-CM | POA: Insufficient documentation

## 2015-06-04 DIAGNOSIS — K088 Other specified disorders of teeth and supporting structures: Secondary | ICD-10-CM | POA: Insufficient documentation

## 2015-06-04 MED ORDER — PENICILLIN V POTASSIUM 250 MG PO TABS: 250.0000 mg | ORAL_TABLET | Freq: Four times a day (QID) | ORAL | Status: AC

## 2015-06-04 NOTE — ED Notes (Signed)
Pt reports 2 day hx of dental pain, radiating into ear

## 2015-06-04 NOTE — ED Provider Notes (Signed)
CSN: [id number]     Arrival date & time 06/04/15  1824 History   First MD Initiated Contact with Patient 06/04/15 1846     Chief Complaint  Patient presents with  . Dental Pain     (Consider location/radiation/quality/duration/timing/severity/associated sxs/prior Treatment) HPI Comments: Patient presents emergency department with chief complaints of dental pain. Patient states that the pain has been ongoing for the past 2 days. He is tried using ibuprofen and Orajel. He denies any difficulty swallowing. He states that he does not have a dentist. There are no aggravating or alleviating factors. He denies any fevers or chills.  The history is provided by the patient. No language interpreter was used.    Past Medical History  Diagnosis Date  . Diabetes mellitus without complication   . Hyperlipidemia   . Hypertension   . Obesity    Past Surgical History  Procedure Laterality Date  . Cholecystectomy  2000   Family History  Problem Relation Age of Onset  . Cancer Mother   . Diabetes Mother   . Hypertension Mother   . Diabetes Sister   . Cancer Maternal Grandmother   . Cancer Son   . Cancer Maternal Aunt   . Heart attack Neg Hx   . Stroke Neg Hx    History  Substance Use Topics  . Smoking status: Never Smoker   . Smokeless tobacco: Never Used  . Alcohol Use: No     Comment: 2014 quit, was drinking 2  beers daily at that time    Review of Systems  Constitutional: Negative for fever and chills.  HENT: Positive for dental problem. Negative for drooling.   Neurological: Negative for speech difficulty.  Psychiatric/Behavioral: Positive for sleep disturbance.      Allergies  Review of patient's allergies indicates no known allergies.  Home Medications   Prior to Admission medications   Medication Sig Start Date End Date Taking? Authorizing Provider  aspirin EC 81 MG tablet Take 1 tablet (81 mg total) by mouth daily. 10/31/14   Leona Singleton, MD  carvedilol  (COREG) 6.25 MG tablet Take 1 tablet (6.25 mg total) by mouth 2 (two) times daily with a meal. 10/31/14   Leona Singleton, MD  glipiZIDE (GLUCOTROL) 10 MG tablet TAKE ONE TABLET BY MOUTH TWICE DAILY BEFORE  MEALS  (PLEASE  FOLLOW  UP  THIS  MONTH) 05/25/15   Leona Singleton, MD  glucose blood (WAVESENSE PRESTO) test strip Use as instructed - Dispense QS for up to twice daily testing. 01/02/14   Moses Manners, MD  HYDROcodone-acetaminophen (NORCO/VICODIN) 5-325 MG per tablet Take 1 tablet by mouth every 6 (six) hours as needed for moderate pain. 02/12/15   Bethann Berkshire, MD  insulin glargine (LANTUS) 100 UNIT/ML injection Inject 20 units daily. Due for primary doctor follow up. 02/22/15   Leona Singleton, MD  lisinopril-hydrochlorothiazide (PRINZIDE,ZESTORETIC) 20-25 MG per tablet Take 1 tablet by mouth daily. 01/15/15   Quintella Reichert, MD  metFORMIN (GLUCOPHAGE) 1000 MG tablet Take 1 tablet (1,000 mg total) by mouth 2 (two) times daily with a meal. Needs MD follow up 05/12/15   Leona Singleton, MD  penicillin v potassium (VEETID) 250 MG tablet Take 1 tablet (250 mg total) by mouth 4 (four) times daily. 06/04/15 06/11/15  Roxy Horseman, PA-C  rosuvastatin (CRESTOR) 20 MG tablet Take 1 tablet (20 mg total) by mouth daily. 01/15/15   Moses Manners, MD   BP 153/92 mmHg  Pulse 84  Temp(Src) 98.3 F (36.8 C) (Oral)  Resp 16  SpO2 100% Physical Exam  Constitutional: He is oriented to person, place, and time. He appears well-developed and well-nourished.  HENT:  Head: Normocephalic and atraumatic.  Mouth/Throat:    Poor dentition throughout.  Affected tooth as diagrammed.  No signs of peritonsillar or tonsillar abscess.  No signs of gingival abscess. Oropharynx is clear and without exudates.  Uvula is midline.  Airway is intact. No signs of Ludwig's angina with palpation of oral and sublingual mucosa.   Eyes: Conjunctivae and EOM are normal.  Neck: Normal range of motion.    Cardiovascular: Normal rate.   Pulmonary/Chest: Effort normal.  Abdominal: He exhibits no distension.  Musculoskeletal: Normal range of motion.  Neurological: He is alert and oriented to person, place, and time.  Skin: Skin is dry.  Psychiatric: He has a normal mood and affect. His behavior is normal. Judgment and thought content normal.  Nursing note and vitals reviewed.   ED Course  Procedures (including critical care time) Labs Review Labs Reviewed - No data to display  Imaging Review No results found.   EKG Interpretation None      MDM   Final diagnoses:  Pain, dental    Patient with toothache.  No gross abscess.  Exam unconcerning for Ludwig's angina or spread of infection.  Will treat with penicillin and OTC pain medicine.  Urged patient to follow-up with dentist.       Roxy Horseman, PA-C 06/04/15 1901  Linwood Dibbles, MD 06/05/15 1415

## 2015-06-04 NOTE — ED Notes (Signed)
Pt left with all belongings and refused wheelchair. 

## 2015-06-04 NOTE — Discharge Instructions (Signed)

## 2015-06-09 ENCOUNTER — Other Ambulatory Visit: Payer: Self-pay | Admitting: Family Medicine

## 2015-07-09 ENCOUNTER — Other Ambulatory Visit: Payer: Self-pay | Admitting: Family Medicine

## 2015-07-14 NOTE — Telephone Encounter (Signed)
Please let Pt know that I have refilled his Metformin. He needs to come in for an office visit if he wants any more refills. He is overdue for a diabetes follow-up. Thank you!

## 2015-07-15 NOTE — Telephone Encounter (Signed)
Letter mailed to patient. Jazmin Hartsell,CMA  

## 2015-08-11 ENCOUNTER — Other Ambulatory Visit: Payer: Self-pay | Admitting: Internal Medicine

## 2015-08-12 NOTE — Telephone Encounter (Signed)
Patient has not been seen for diabetes follow-up since Feb 2016. Will give him a 30 day supply of Metformin, but he will have to schedule a follow-up visit before he can get any more medication refills.

## 2015-08-13 NOTE — Telephone Encounter (Signed)
Mailed letter to pt today to schedule DM FU with PCP. Evette Diclemente, CMA.

## 2015-08-19 ENCOUNTER — Other Ambulatory Visit: Payer: Self-pay | Admitting: Family Medicine

## 2015-08-28 ENCOUNTER — Other Ambulatory Visit: Payer: Self-pay | Admitting: Cardiology

## 2015-08-28 ENCOUNTER — Other Ambulatory Visit: Payer: Self-pay | Admitting: Internal Medicine

## 2015-08-28 MED ORDER — GLIPIZIDE 10 MG PO TABS: ORAL_TABLET | ORAL | Status: DC

## 2015-08-28 MED ORDER — LISINOPRIL-HYDROCHLOROTHIAZIDE 20-25 MG PO TABS: 1.0000 | ORAL_TABLET | Freq: Every day | ORAL | Status: DC

## 2015-08-28 NOTE — Telephone Encounter (Signed)
Patient was instructed at his last office visit to follow up with a NP/PA in two weeks and prn with Dr Mayford Knife. Please advise on refill. Thanks, MI

## 2015-08-28 NOTE — Telephone Encounter (Signed)
LM stating refills were sent to pharmacy and for pt to call back. If pt calls back please advise him rx were sent to pharmacy. Adams,Latoya, CMA.

## 2015-08-28 NOTE — Telephone Encounter (Signed)
Please let Patrick [name] know that I have refilled his medications. Thank you!

## 2015-08-28 NOTE — Telephone Encounter (Signed)
Pt called because he is out of his Metformin, Glipizide, and Lisinopril. He does have an appointment with the doctor but it is not until 09/10/15. Can we refill his medications with enough medication to get him until his appointment at the end of the month. Please inform patient . jw

## 2015-08-29 NOTE — Telephone Encounter (Signed)
Ok to refill for 3 months but needs to followup with PCP for further BP treatment

## 2015-09-10 ENCOUNTER — Ambulatory Visit (INDEPENDENT_AMBULATORY_CARE_PROVIDER_SITE_OTHER): Payer: Self-pay | Admitting: Internal Medicine

## 2015-09-10 ENCOUNTER — Encounter: Payer: Self-pay | Admitting: Internal Medicine

## 2015-09-10 VITALS — BP 150/97 | HR 95 | Temp 98.1°F | Wt 244.8 lb

## 2015-09-10 DIAGNOSIS — I10 Essential (primary) hypertension: Secondary | ICD-10-CM

## 2015-09-10 DIAGNOSIS — M25512 Pain in left shoulder: Secondary | ICD-10-CM

## 2015-09-10 DIAGNOSIS — E119 Type 2 diabetes mellitus without complications: Secondary | ICD-10-CM

## 2015-09-10 LAB — POCT GLYCOSYLATED HEMOGLOBIN (HGB A1C): Hemoglobin A1C: 11.3

## 2015-09-10 MED ORDER — INSULIN GLARGINE 100 UNIT/ML ~~LOC~~ SOLN: 10.0000 [IU] | Freq: Every day | SUBCUTANEOUS | Status: DC

## 2015-09-10 MED ORDER — AMLODIPINE BESYLATE 5 MG PO TABS: 5.0000 mg | ORAL_TABLET | Freq: Every day | ORAL | Status: DC

## 2015-09-10 NOTE — Patient Instructions (Signed)
Hypertension: I have prescribed another blood pressure medication called Norvasc. You should take this everyday.  Diabetes: I have prescribed Lantus 10 units. Please inject this everyday at bedtime. Please make sure that you do not inject it at the same time that you're taking the glipizide.  Shoulder: Please continue to take the Aleve for pain. You should use ice and heat every day to help with the pain. Please try some of these exercises below to strengthen your shoulder.  It was so nice to meet you! -Dr. Nancy Marus   Shoulder Exercises EXERCISES  RANGE OF MOTION (ROM) AND STRETCHING EXERCISES These exercises may help you when beginning to rehabilitate your injury. Your symptoms may resolve with or without further involvement from your physician, physical therapist or athletic trainer. While completing these exercises, remember:   Restoring tissue flexibility helps normal motion to return to the joints. This allows healthier, less painful movement and activity.  An effective stretch should be held for at least 30 seconds.  A stretch should never be painful. You should only feel a gentle lengthening or release in the stretched tissue. ROM - Pendulum  Bend at the waist so that your right / left arm falls away from your body. Support yourself with your opposite hand on a solid surface, such as a table or a countertop.  Your right / left arm should be perpendicular to the ground. If it is not perpendicular, you need to lean over farther. Relax the muscles in your right / left arm and shoulder as much as possible.  Gently sway your hips and trunk so they move your right / left arm without any use of your right / left shoulder muscles.  Progress your movements so that your right / left arm moves side to side, then forward and backward, and finally, both clockwise and counterclockwise.  Complete __________ repetitions in each direction. Many people use this exercise to relieve discomfort in  their shoulder as well as to gain range of motion. Repeat __________ times. Complete this exercise __________ times per day. STRETCH - Flexion, Standing  Stand with good posture. With an underhand grip on your right / left hand and an overhand grip on the opposite hand, grasp a broomstick or cane so that your hands are a little more than shoulder-width apart.  Keeping your right / left elbow straight and shoulder muscles relaxed, push the stick with your opposite hand to raise your right / left arm in front of your body and then overhead. Raise your arm until you feel a stretch in your right / left shoulder, but before you have increased shoulder pain.  Try to avoid shrugging your right / left shoulder as your arm rises by keeping your shoulder blade tucked down and toward your mid-back spine. Hold __________ seconds.  Slowly return to the starting position. Repeat __________ times. Complete this exercise __________ times per day. STRETCH - Internal Rotation  Place your right / left hand behind your back, palm-up.  Throw a towel or belt over your opposite shoulder. Grasp the towel/belt with your right / left hand.  While keeping an upright posture, gently pull up on the towel/belt until you feel a stretch in the front of your right / left shoulder.  Avoid shrugging your right / left shoulder as your arm rises by keeping your shoulder blade tucked down and toward your mid-back spine.  Hold __________. Release the stretch by lowering your opposite hand. Repeat __________ times. Complete this exercise __________ times per  day. STRETCH - External Rotation and Abduction  Stagger your stance through a doorframe. It does not matter which foot is forward.  As instructed by your physician, physical therapist or athletic trainer, place your hands:  And forearms above your head and on the door frame.  And forearms at head-height and on the door frame.  At elbow-height and on the door  frame.  Keeping your head and chest upright and your stomach muscles tight to prevent over-extending your low-back, slowly shift your weight onto your front foot until you feel a stretch across your chest and/or in the front of your shoulders.  Hold __________ seconds. Shift your weight to your back foot to release the stretch. Repeat __________ times. Complete this stretch __________ times per day.  STRENGTHENING EXERCISES  These exercises may help you when beginning to rehabilitate your injury. They may resolve your symptoms with or without further involvement from your physician, physical therapist or athletic trainer. While completing these exercises, remember:   Muscles can gain both the endurance and the strength needed for everyday activities through controlled exercises.  Complete these exercises as instructed by your physician, physical therapist or athletic trainer. Progress the resistance and repetitions only as guided.  You may experience muscle soreness or fatigue, but the pain or discomfort you are trying to eliminate should never worsen during these exercises. If this pain does worsen, stop and make certain you are following the directions exactly. If the pain is still present after adjustments, discontinue the exercise until you can discuss the trouble with your clinician.  If advised by your physician, during your recovery, avoid activity or exercises which involve actions that place your right / left hand or elbow above your head or behind your back or head. These positions stress the tissues which are trying to heal. STRENGTH - Scapular Depression and Adduction  With good posture, sit on a firm chair. Supported your arms in front of you with pillows, arm rests or a table top. Have your elbows in line with the sides of your body.  Gently draw your shoulder blades down and toward your mid-back spine. Gradually increase the tension without tensing the muscles along the top of your  shoulders and the back of your neck.  Hold for __________ seconds. Slowly release the tension and relax your muscles completely before completing the next repetition.  After you have practiced this exercise, remove the arm support and complete it in standing as well as sitting. Repeat __________ times. Complete this exercise __________ times per day.  STRENGTH - External Rotators  Secure a rubber exercise band/tubing to a fixed object so that it is at the same height as your right / left elbow when you are standing or sitting on a firm surface.  Stand or sit so that the secured exercise band/tubing is at your side that is not injured.  Bend your elbow 90 degrees. Place a folded towel or small pillow under your right / left arm so that your elbow is a few inches away from your side.  Keeping the tension on the exercise band/tubing, pull it away from your body, as if pivoting on your elbow. Be sure to keep your body steady so that the movement is only coming from your shoulder rotating.  Hold __________ seconds. Release the tension in a controlled manner as you return to the starting position. Repeat __________ times. Complete this exercise __________ times per day.  STRENGTH - Supraspinatus  Stand or sit with good posture.  Grasp a __________ weight or an exercise band/tubing so that your hand is "thumbs-up," like when you shake hands.  Slowly lift your right / left hand from your thigh into the air, traveling about 30 degrees from straight out at your side. Lift your hand to shoulder height or as far as you can without increasing any shoulder pain. Initially, many people do not lift their hands above shoulder height.  Avoid shrugging your right / left shoulder as your arm rises by keeping your shoulder blade tucked down and toward your mid-back spine.  Hold for __________ seconds. Control the descent of your hand as you slowly return to your starting position. Repeat __________ times.  Complete this exercise __________ times per day.  STRENGTH - Shoulder Extensors  Secure a rubber exercise band/tubing so that it is at the height of your shoulders when you are either standing or sitting on a firm arm-less chair.  With a thumbs-up grip, grasp an end of the band/tubing in each hand. Straighten your elbows and lift your hands straight in front of you at shoulder height. Step back away from the secured end of band/tubing until it becomes tense.  Squeezing your shoulder blades together, pull your hands down to the sides of your thighs. Do not allow your hands to go behind you.  Hold for __________ seconds. Slowly ease the tension on the band/tubing as you reverse the directions and return to the starting position. Repeat __________ times. Complete this exercise __________ times per day.  STRENGTH - Scapular Retractors  Secure a rubber exercise band/tubing so that it is at the height of your shoulders when you are either standing or sitting on a firm arm-less chair.  With a palm-down grip, grasp an end of the band/tubing in each hand. Straighten your elbows and lift your hands straight in front of you at shoulder height. Step back away from the secured end of band/tubing until it becomes tense.  Squeezing your shoulder blades together, draw your elbows back as you bend them. Keep your upper arm lifted away from your body throughout the exercise.  Hold __________ seconds. Slowly ease the tension on the band/tubing as you reverse the directions and return to the starting position. Repeat __________ times. Complete this exercise __________ times per day. STRENGTH - Scapular Depressors  Find a sturdy chair without wheels, such as a from a dining room table.  Keeping your feet on the floor, lift your bottom from the seat and lock your elbows.  Keeping your elbows straight, allow gravity to pull your body weight down. Your shoulders will rise toward your ears.  Raise your body  against gravity by drawing your shoulder blades down your back, shortening the distance between your shoulders and ears. Although your feet should always maintain contact with the floor, your feet should progressively support less body weight as you get stronger.  Hold __________ seconds. In a controlled and slow manner, lower your body weight to begin the next repetition. Repeat __________ times. Complete this exercise __________ times per day.  Document Released: 10/12/2005 Document Revised: 02/20/2012 Document Reviewed: 03/12/2009 [facility] Patient Information 2015 [place], [place]. This information is not intended to replace advice given to you by your health care provider. Make sure you discuss any questions you have with your health care provider.

## 2015-09-10 NOTE — Assessment & Plan Note (Addendum)
Last HgbA1c is 10.4 in 10/2014. Repeat HgbA1c is 11.3 today. Pt is supposed to be taking Lantus 20 units, but has not been taking this because it makes him "feel weird".  -Will restart Lantus at 10 units at titrate up -Advised Pt to take Lantus at night and to not take it at the same time as the Glipizide -Pt is due for a urine microalbumin but he does not have insurance. He is already on an ACE-inhibitor, so I don't think this is a necessary test, as he already has difficulty affording his healthcare. -Pt to follow-up in 1 month

## 2015-09-10 NOTE — Assessment & Plan Note (Addendum)
BP 150/70 today. Pt only taking Lisinopril-HCTZ 20-25mg . States he takes this daily. - Will add Norvasc  today - Pt to follow-up in 1 month

## 2015-09-10 NOTE — Assessment & Plan Note (Signed)
Shoulder pain x 2 years. Has worsened in the last few months. I suspect he has underlying osteoarthritis with a component of biceps tendonitis, given his +Speed's test and TTP over the biceps tendon. He also may have a component of impingement, given his +Neer's test. No concern for rotator cuff tear because his strength is intact. No concern for frozen shoulder because he has full ROM. -Would like to refer to Sports Medicine for further evaluation and management, but he is uninsured and would have a difficult time affording this. -Advised Pt to continue using Aleve, because this has helped him the most -Also encouraged Pt to use ice and heat as needed -Pt given a handout on shoulder exercises that he can try at home -Will follow-up in 1 month

## 2015-09-10 NOTE — Progress Notes (Signed)
[facility] Phone: [phone]  Subjective:  HYPERTENSION Disease Monitoring Blood pressure range- Doesn't check Chest pain- No      Dyspnea- No Medications- Lisinopril-HCTZ 20-25mg  qd Compliance- Yes Lightheadedness- Yes   Edema- No -BP is 150/97 today -Had a stress test a few months ago, which was normal   DIABETES Disease Monitoring Blood Sugar ranges- high 200s Polyuria- Yes New Visual problems- Yes Medications- Lantus 20 units, Glipizide  bid, Metformin  Compliance- No Hypoglycemic symptoms- Sometimes shaky and sweaty -Last HgbA1c was 10.4. -Checking blood sugars only if he feels dizzy. Sugars are always in the 200s. -Hasn't been taking the Lantus, because "makes him feel weird" and "weak". Used it for a couple of months. Takes the Glipizide and Metformin every day. -Has been having some blurry vision.   LEFT SHOULDER PAIN: Has been going on for 2 years. Has gotten worse the last couple of months. Pain is shooting and stabbing in quality. Worse with flexion and abduction. Nothing makes it better. Went to the ED and was given muscle relaxers and narcotics, neither of which helped. Aleve has helped the most. Pain is normally a 5/10, sometimes up to 10/10.  All other ROS were reviewed and are negative unless otherwise noted in the HPI. Past Medical History- significant for HTN, T2DM, HLD, obesity Reviewed problem list.  Medications- reviewed and updated Current Outpatient Prescriptions  Medication Sig Dispense Refill  . aspirin EC 81 MG tablet Take 1 tablet (81 mg total) by mouth daily. 90 tablet 3  . carvedilol (COREG) 6.25 MG tablet Take 1 tablet (6.25 mg total) by mouth 2 (two) times daily with a meal. 60 tablet 3  . glipiZIDE (GLUCOTROL) 10 MG tablet TAKE ONE TABLET BY MOUTH TWICE DAILY BEFORE  MEALS 180 tablet 0  . glucose blood (WAVESENSE PRESTO) test strip Use as instructed - Dispense QS for up to twice daily testing. 50 each 12  .  HYDROcodone-acetaminophen (NORCO/VICODIN) 5-325 MG per tablet Take 1 tablet by mouth every 6 (six) hours as needed for moderate pain. 20 tablet 0  . insulin glargine (LANTUS) 100 UNIT/ML injection Inject 20 units daily. Due for primary doctor follow up. 10 mL 5  . lisinopril-hydrochlorothiazide (PRINZIDE,ZESTORETIC) 20-25 MG per tablet TAKE ONE TABLET BY MOUTH DAILY 30 tablet 2  . lisinopril-hydrochlorothiazide (PRINZIDE,ZESTORETIC) 20-25 MG per tablet Take 1 tablet by mouth daily. 30 tablet 6  . metFORMIN (GLUCOPHAGE) 1000 MG tablet TAKE ONE TABLET BY MOUTH TWICE DAILY WITH A MEAL **NEEDS  MD  FOLLOW  UP** 30 tablet 0  . rosuvastatin (CRESTOR) 20 MG tablet Take 1 tablet (20 mg total) by mouth daily. 30 tablet 11   No current facility-administered medications for this visit.   Chief complaint-noted Family history reviewed for today's visit. No changes. Social history- patient is a never smoker  Objective: BP 150/97 mmHg  Pulse 95  Temp(Src) 98.1 F (36.7 C) (Oral)  Wt 244 lb 12.8 oz (111.041 kg) Gen: NAD, alert, cooperative with exam HEENT: NCAT, EOMI, MMM Neck: FROM, supple, no adenopathy CV: Regular rhythm, mildly tachycardic, no m/r/g Resp: CTABL, no wheezes, non-labored GI: SNTND, BS present, no guarding or organomegaly Msk: No edema.   L shoulder: No gross deformity. Symmetric in comparison to the R shoulder. Full ROM. TTP over [facility] joint, biceps tendon, and lateral deltoid. 5/5 strength in UE's bilaterally. Normal sensation. +Speed's test, +Neer's test, -O'Brien's test, +Empty can test Neuro: Alert and oriented, no gross deficits Skin: no rashes, no lesions  Psych: Appropriate mood and affect  Assessment/Plan: See problem based a/p   Willadean Carol, MD PGY-1

## 2015-09-11 ENCOUNTER — Telehealth: Payer: Self-pay | Admitting: *Deleted

## 2015-09-11 ENCOUNTER — Other Ambulatory Visit: Payer: Self-pay | Admitting: Internal Medicine

## 2015-09-11 DIAGNOSIS — E119 Type 2 diabetes mellitus without complications: Secondary | ICD-10-CM

## 2015-09-11 MED ORDER — INSULIN GLARGINE 100 UNIT/ML ~~LOC~~ SOLN: 10.0000 [IU] | Freq: Every day | SUBCUTANEOUS | Status: DC

## 2015-09-11 NOTE — Telephone Encounter (Signed)
I have sent in a new prescription to the pharmacy. He should inject 10 units at bedtime. Thank you!

## 2015-09-11 NOTE — Telephone Encounter (Signed)
Received a fax from [facility] needing provider to verify the directions of lantus.  Sig: Inject 10 Units into the skin at bedtime.  Inject 20 units daily.  Please give [facility] a call to verify or send new Rx with correct directions.  Clovis Pu, RN

## 2015-09-24 ENCOUNTER — Other Ambulatory Visit: Payer: Self-pay | Admitting: Internal Medicine

## 2015-10-09 ENCOUNTER — Other Ambulatory Visit: Payer: Self-pay | Admitting: Internal Medicine

## 2015-11-09 ENCOUNTER — Other Ambulatory Visit: Payer: Self-pay | Admitting: Internal Medicine

## 2016-02-03 ENCOUNTER — Other Ambulatory Visit: Payer: Self-pay | Admitting: Internal Medicine

## 2016-04-26 ENCOUNTER — Other Ambulatory Visit: Payer: Self-pay | Admitting: Internal Medicine

## 2016-04-26 NOTE — Telephone Encounter (Signed)
Medications refilled. Pt is due for diabetes follow-up.

## 2016-04-26 NOTE — Telephone Encounter (Signed)
I spoke with pt and stated it was time for his f/u Dm visit. Pt stated he is trying to get his orange card in order, he does not have any funds at this time. I encouraged pt to call as soon as he is able to make an apt. Page, cma.

## 2016-05-27 ENCOUNTER — Other Ambulatory Visit: Payer: Self-pay | Admitting: Internal Medicine

## 2016-05-27 DIAGNOSIS — I10 Essential (primary) hypertension: Secondary | ICD-10-CM

## 2016-05-27 MED ORDER — METFORMIN HCL 1000 MG PO TABS: ORAL_TABLET | ORAL | Status: DC

## 2016-05-27 MED ORDER — AMLODIPINE BESYLATE 5 MG PO TABS: 5.0000 mg | ORAL_TABLET | Freq: Every day | ORAL | Status: DC

## 2016-05-27 MED ORDER — GLIPIZIDE 10 MG PO TABS: ORAL_TABLET | ORAL | Status: DC

## 2016-05-27 MED ORDER — LISINOPRIL-HYDROCHLOROTHIAZIDE 20-25 MG PO TABS: 1.0000 | ORAL_TABLET | Freq: Every day | ORAL | Status: DC

## 2016-05-27 NOTE — Telephone Encounter (Signed)
Has appt July 3. He needs enough med ( blood pressure and diabetic pills  There are 3 of them.) to last until his appt.  He just took his last pill this morning.   [facility]

## 2016-05-27 NOTE — Telephone Encounter (Signed)
Medications refilled

## 2016-05-27 NOTE — Telephone Encounter (Signed)
Medication refilled

## 2016-06-13 ENCOUNTER — Ambulatory Visit: Payer: No Typology Code available for payment source | Admitting: Internal Medicine

## 2016-06-29 ENCOUNTER — Encounter: Payer: Self-pay | Admitting: Internal Medicine

## 2016-06-29 ENCOUNTER — Ambulatory Visit (INDEPENDENT_AMBULATORY_CARE_PROVIDER_SITE_OTHER): Payer: No Typology Code available for payment source | Admitting: Internal Medicine

## 2016-06-29 VITALS — BP 173/90 | HR 99 | Temp 98.7°F | Wt 246.0 lb

## 2016-06-29 DIAGNOSIS — E119 Type 2 diabetes mellitus without complications: Secondary | ICD-10-CM

## 2016-06-29 DIAGNOSIS — I10 Essential (primary) hypertension: Secondary | ICD-10-CM

## 2016-06-29 LAB — POCT GLYCOSYLATED HEMOGLOBIN (HGB A1C): Hemoglobin A1C: 10.4

## 2016-06-29 MED ORDER — AMLODIPINE BESYLATE 5 MG PO TABS: 5.0000 mg | ORAL_TABLET | Freq: Every day | ORAL | Status: DC

## 2016-06-29 MED ORDER — LISINOPRIL-HYDROCHLOROTHIAZIDE 20-25 MG PO TABS: 1.0000 | ORAL_TABLET | Freq: Every day | ORAL | Status: DC

## 2016-06-29 NOTE — Assessment & Plan Note (Addendum)
Uncontrolled. A1c 10.4% today, improved from 11.3% previously. Currently taking Glipizide and Metformin. Not taking his Lantus. - Restart Lantus 10 units daily. Pt to go to the health department ASAP for medication assistance. - Advised Pt to check blood sugars twice daily - Continue Metformin 1000mg  bid and Glipizide 10mg  bid - Diabetic foot exam performed today and was normal - Will refer to Ophthalmology when he gets insurance - Follow-up in 3 months for recheck.

## 2016-06-29 NOTE — Patient Instructions (Signed)
It was so nice to see you!  Please start taking your Norvasc every day. I have sent a refill into your pharmacy.  It is also very important that you restart your Lantus. Please go to the Health Department and try to get the Lantus for free.   We will see you back in 2-3 months.  -Dr. Nancy MarusMayo

## 2016-06-29 NOTE — Assessment & Plan Note (Addendum)
Uncontrolled. BP elevated at 159/90, worse at re-check. Pt has been taking Lisinopril-HCTZ but has not been taking Norvasc. - Continue Lisinopril-HCTZ 20-25mg  daily - Restart Norvasc 5mg  daily. Will likely need to increase dose at next visit. - Follow-up in 2 months for BP follow-up.

## 2016-06-29 NOTE — Progress Notes (Signed)
[facility] Phone: [phone]  Subjective:  T2DM: Hasn't been taking his Lantus for the last 4 months just because he doesn't like taking it. He has had a lot of family members die from complications of diabetes and he wants to make a change. He would like to restart the Lantus, but is currently out. He is planning on going to the Health Department to see if they will assist in paying for the Lantus.  He has been taking the Glipizide and the Metformin. No side effects to these medications. He endorses occasional polydipsia. No shakiness or sweatiness. Does not check his blood sugars at home.  Hypertension: Hasn't been taking the Norvasc. He doesn't check his blood pressures. He takes the Lisinopril-HCTZ daily. No dizziness, no chest pain, no swelling in his feet.   ROS: See HPI for pertinent positives and negatives Past Medical History- HTN, T2DM, HLD, obesity Reviewed problem list.  Medications- reviewed and updated Current Outpatient Prescriptions  Medication Sig Dispense Refill  . amLODipine (NORVASC) 5 MG tablet Take 1 tablet (5 mg total) by mouth daily. 90 tablet 3  . aspirin EC 81 MG tablet Take 1 tablet (81 mg total) by mouth daily. 90 tablet 3  . glipiZIDE (GLUCOTROL) 10 MG tablet TAKE ONE TABLET BY MOUTH TWICE DAILY BEFORE MEAL(S) 180 tablet 0  . glucose blood (WAVESENSE PRESTO) test strip Use as instructed - Dispense QS for up to twice daily testing. 50 each 12  . HYDROcodone-acetaminophen (NORCO/VICODIN) 5-325 MG per tablet Take 1 tablet by mouth every 6 (six) hours as needed for moderate pain. 20 tablet 0  . insulin glargine (LANTUS) 100 UNIT/ML injection Inject 0.1 mLs (10 Units total) into the skin at bedtime. 10 mL 5  . lisinopril-hydrochlorothiazide (PRINZIDE,ZESTORETIC) 20-25 MG per tablet Take 1 tablet by mouth daily. 30 tablet 6  . lisinopril-hydrochlorothiazide (PRINZIDE,ZESTORETIC) 20-25 MG tablet Take 1 tablet by mouth daily. 30 tablet 2  .  metFORMIN (GLUCOPHAGE) 1000 MG tablet TAKE ONE TABLET BY MOUTH TWICE DAILY WITH  A  MEAL 60 tablet 0  . metFORMIN (GLUCOPHAGE) 1000 MG tablet TAKE ONE TABLET BY MOUTH TWICE DAILY WITH  A  MEAL 60 tablet 2  . rosuvastatin (CRESTOR) 20 MG tablet Take 1 tablet (20 mg total) by mouth daily. 30 tablet 11   No current facility-administered medications for this visit.   Chief complaint-noted Family history reviewed for today's visit. No changes. Social history- patient is a never smoker  Objective: BP 151/80 mmHg  Pulse 99  Temp(Src) 98.7 F (37.1 C) (Oral)  Wt 246 lb (111.585 kg)  SpO2 100% Gen: NAD, alert, cooperative with exam HEENT: NCAT, EOMI, MMM Neck: FROM, supple CV: RRR, no murmur Resp: CTABL, no wheezes, normal work of breathing Msk: No edema, warm, normal tone, moves UE/LE spontaneously Diabetic foot exam: No deformities, no ulcerations, no skin breakdown, sensation intact to touch and monofilament testing bilaterally, PT and DP pulses intact, toenails mildly thickened. Neuro: Alert and oriented, no gross deficits Skin: No rashes, no lesions Psych: Appropriate behavior  Assessment/Plan: T2DM: Uncontrolled. A1c 10.4% today, improved from 11.3% previously. Currently taking Glipizide and Metformin. Not taking his Lantus. - Restart Lantus 10 units daily. Pt to go to the health department ASAP for medication assistance. - Advised Pt to check blood sugars twice daily - Continue Metformin  bid and Glipizide  bid - Diabetic foot exam performed today and was normal - Will refer to Ophthalmology when he gets insurance - Follow-up in  3 months for recheck.  Hypertension:  Uncontrolled. BP elevated at 159/90, worse at re-check. Pt has been taking Lisinopril-HCTZ but has not been taking Norvasc. - Continue Lisinopril-HCTZ 20-25mg  daily - Restart Norvasc 5mg  daily. Will likely need to increase dose at next visit. - Follow-up in 2 months for BP follow-up.  Willadean CarolKaty Posie Lillibridge,  MD PGY-2

## 2016-07-26 ENCOUNTER — Other Ambulatory Visit: Payer: Self-pay | Admitting: Internal Medicine

## 2016-09-23 ENCOUNTER — Other Ambulatory Visit: Payer: Self-pay | Admitting: Internal Medicine

## 2016-10-24 ENCOUNTER — Other Ambulatory Visit: Payer: Self-pay | Admitting: Internal Medicine

## 2016-11-23 ENCOUNTER — Other Ambulatory Visit: Payer: Self-pay | Admitting: Internal Medicine

## 2016-12-23 ENCOUNTER — Other Ambulatory Visit: Payer: Self-pay | Admitting: Internal Medicine

## 2017-01-20 ENCOUNTER — Other Ambulatory Visit: Payer: Self-pay | Admitting: Internal Medicine

## 2017-03-21 ENCOUNTER — Other Ambulatory Visit: Payer: Self-pay | Admitting: Internal Medicine

## 2017-05-19 ENCOUNTER — Other Ambulatory Visit: Payer: Self-pay | Admitting: Internal Medicine

## 2017-06-16 ENCOUNTER — Other Ambulatory Visit: Payer: Self-pay | Admitting: Internal Medicine

## 2017-07-07 ENCOUNTER — Other Ambulatory Visit: Payer: Self-pay | Admitting: Internal Medicine

## 2017-07-07 DIAGNOSIS — I10 Essential (primary) hypertension: Secondary | ICD-10-CM

## 2017-07-10 ENCOUNTER — Other Ambulatory Visit: Payer: Self-pay | Admitting: Internal Medicine

## 2017-07-10 ENCOUNTER — Other Ambulatory Visit: Payer: Self-pay | Admitting: *Deleted

## 2017-07-10 DIAGNOSIS — I10 Essential (primary) hypertension: Secondary | ICD-10-CM

## 2017-07-10 MED ORDER — AMLODIPINE BESYLATE 5 MG PO TABS: 5.0000 mg | ORAL_TABLET | Freq: Every day | ORAL | 11 refills | Status: DC

## 2017-07-25 ENCOUNTER — Emergency Department (HOSPITAL_COMMUNITY): Payer: Self-pay

## 2017-07-25 ENCOUNTER — Encounter (HOSPITAL_COMMUNITY): Payer: Self-pay | Admitting: Emergency Medicine

## 2017-07-25 ENCOUNTER — Other Ambulatory Visit: Payer: Self-pay

## 2017-07-25 ENCOUNTER — Emergency Department (HOSPITAL_COMMUNITY)
Admission: EM | Admit: 2017-07-25 | Discharge: 2017-07-25 | Disposition: A | Discharge status: Home / Self Care | Payer: Self-pay | Attending: Emergency Medicine | Admitting: Emergency Medicine

## 2017-07-25 DIAGNOSIS — R079 Chest pain, unspecified: Secondary | ICD-10-CM

## 2017-07-25 DIAGNOSIS — Z7982 Long term (current) use of aspirin: Secondary | ICD-10-CM | POA: Insufficient documentation

## 2017-07-25 DIAGNOSIS — Z794 Long term (current) use of insulin: Secondary | ICD-10-CM | POA: Insufficient documentation

## 2017-07-25 DIAGNOSIS — I10 Essential (primary) hypertension: Secondary | ICD-10-CM | POA: Insufficient documentation

## 2017-07-25 DIAGNOSIS — E119 Type 2 diabetes mellitus without complications: Secondary | ICD-10-CM | POA: Insufficient documentation

## 2017-07-25 DIAGNOSIS — Z79899 Other long term (current) drug therapy: Secondary | ICD-10-CM | POA: Insufficient documentation

## 2017-07-25 DIAGNOSIS — R0789 Other chest pain: Secondary | ICD-10-CM | POA: Insufficient documentation

## 2017-07-25 DIAGNOSIS — R0989 Other specified symptoms and signs involving the circulatory and respiratory systems: Secondary | ICD-10-CM

## 2017-07-25 LAB — CBC
HEMATOCRIT: 38.4 % — AB (ref 39.0–52.0)
Hemoglobin: 13.6 g/dL (ref 13.0–17.0)
MCH: 30.3 pg (ref 26.0–34.0)
MCHC: 35.4 g/dL (ref 30.0–36.0)
MCV: 85.5 fL (ref 78.0–100.0)
PLATELETS: 264 10*3/uL (ref 150–400)
RBC: 4.49 MIL/uL (ref 4.22–5.81)
RDW: 12.7 % (ref 11.5–15.5)
WBC: 8 10*3/uL (ref 4.0–10.5)

## 2017-07-25 LAB — D-DIMER, QUANTITATIVE: D-Dimer, Quant: 0.82 ug/mL-FEU — ABNORMAL HIGH (ref 0.00–0.50)

## 2017-07-25 LAB — BASIC METABOLIC PANEL
Anion gap: 9 (ref 5–15)
BUN: 16 mg/dL (ref 6–20)
CHLORIDE: 101 mmol/L (ref 101–111)
CO2: 26 mmol/L (ref 22–32)
CREATININE: 1.18 mg/dL (ref 0.61–1.24)
Calcium: 9.4 mg/dL (ref 8.9–10.3)
GFR calc Af Amer: >60 mL/min (ref >60)
GLUCOSE: 349 mg/dL — AB (ref 65–99)
POTASSIUM: 4.1 mmol/L (ref 3.5–5.1)
Sodium: 136 mmol/L (ref 135–145)

## 2017-07-25 LAB — I-STAT TROPONIN, ED: Troponin i, poc: 0 ng/mL (ref 0.00–0.08)

## 2017-07-25 MED ORDER — SODIUM CHLORIDE 0.9 % IV BOLUS (SEPSIS): 1000.0000 mL | Freq: Once | INTRAVENOUS | Status: DC

## 2017-07-25 MED ORDER — ENOXAPARIN SODIUM 120 MG/0.8ML ~~LOC~~ SOLN
1.0000 mg/kg | SUBCUTANEOUS | Status: DC
  Administered 2017-07-25: 115 mg via SUBCUTANEOUS
  Filled 2017-07-25: qty 0.8

## 2017-07-25 NOTE — ED Triage Notes (Signed)
Pt sts left sided CP that comes and goes as well as some bilateral leg pain with some right leg swelling

## 2017-07-25 NOTE — Discharge Instructions (Signed)
You have a suspected blood clot in your leg You have been given an injection of medication to help it resolve Please return int he morning for a specialized test for further evaluation

## 2017-07-25 NOTE — ED Notes (Signed)
Declined W/C at D/C and was escorted to lobby by RN. 

## 2017-07-25 NOTE — ED Provider Notes (Signed)
[facility]-EMERGENCY DEPT Provider Note   CSN: [id number] Arrival date & time: 07/25/17  1721     History   Chief Complaint Chief Complaint  Patient presents with  . Chest Pain    HPI Patrick [name] is a 46 y.o. male.  This a 46 year old morbidly obese male who reports 3 days of sharp stabbing chest pain that today feels sore and is reproducible with movement.  He also reports that he has pain in the back of his right calf and when he walks.  He's got pain in his right thigh. Denies any shortness of breath, nausea or diaphoresis.  Denies any recent travel or trauma.  He does not work outside of the house. He states he is compliant with his medications, although he has not seen his PCP in over 6 months.  Despite the fact that he should be seen every 3 months.  Does not have an appointment with his PCP. States that he's been eating poorly and not following his diabetic diet      Past Medical History:  Diagnosis Date  . Diabetes mellitus without complication (HCC)   . Hyperlipidemia   . Hypertension   . Obesity     Patient Active Problem List   Diagnosis Date Noted  . Intermittent chest pain 10/31/2014  . OBESITY 07/30/2010  . Disorder of teeth and supporting structures 05/13/2009  . FOOT PAIN, RIGHT 05/13/2009  . Hyperlipidemia 02/21/2008  . HYPERTENSION, BENIGN ESSENTIAL 02/21/2008  . Diabetes mellitus without complication (HCC) 09/04/2007  . SHOULDER PAIN 09/04/2007  . PROTEINURIA 09/04/2007    Past Surgical History:  Procedure Laterality Date  . CHOLECYSTECTOMY  2000       Home Medications    Prior to Admission medications   Medication Sig Start Date End Date Taking? Authorizing Provider  amLODipine (NORVASC) 5 MG tablet Take 1 tablet (5 mg total) by mouth daily. 07/10/17   Mayo, Allyn KennerKaty Dodd, MD  aspirin EC 81 MG tablet Take 1 tablet (81 mg total) by mouth daily. 10/31/14   Leona Singletonhekkekandam, Maria T, MD  glipiZIDE (GLUCOTROL) 10 MG tablet TAKE ONE TABLET BY MOUTH  TWICE DAILY BEFORE MEAL(S) 07/26/16   Mayo, Allyn KennerKaty Dodd, MD  glipiZIDE (GLUCOTROL) 10 MG tablet TAKE 1 TABLET BY MOUTH TWICE DAILY BEFORE  MEALS 07/11/17   Mayo, Allyn KennerKaty Dodd, MD  glucose blood (WAVESENSE PRESTO) test strip Use as instructed - Dispense QS for up to twice daily testing. 01/02/14   Moses MannersHensel, William A, MD  HYDROcodone-acetaminophen (NORCO/VICODIN) 5-325 MG per tablet Take 1 tablet by mouth every 6 (six) hours as needed for moderate pain. 02/12/15   Bethann BerkshireZammit, Joseph, MD  insulin glargine (LANTUS) 100 UNIT/ML injection Inject 0.1 mLs (10 Units total) into the skin at bedtime. 09/11/15   Mayo, Allyn KennerKaty Dodd, MD  lisinopril-hydrochlorothiazide (PRINZIDE,ZESTORETIC) 20-25 MG tablet Take 1 tablet by mouth daily. 06/29/16   Mayo, Allyn KennerKaty Dodd, MD  lisinopril-hydrochlorothiazide (PRINZIDE,ZESTORETIC) 20-25 MG tablet TAKE ONE TABLET BY MOUTH ONCE DAILY 05/19/17   Mayo, Allyn KennerKaty Dodd, MD  metFORMIN (GLUCOPHAGE) 1000 MG tablet TAKE ONE TABLET BY MOUTH TWICE DAILY WITH  A  MEAL 05/27/16   Mayo, Allyn KennerKaty Dodd, MD  metFORMIN (GLUCOPHAGE) 1000 MG tablet TAKE 1 TABLET BY MOUTH TWICE DAILY WITH A MEAL 06/20/17   Mayo, Allyn KennerKaty Dodd, MD  rosuvastatin (CRESTOR) 20 MG tablet Take 1 tablet (20 mg total) by mouth daily. 01/15/15   Moses MannersHensel, William A, MD    Family History Family History  Problem Relation Age of Onset  .  Cancer Mother   . Diabetes Mother   . Hypertension Mother   . Diabetes Sister   . Cancer Maternal Grandmother   . Cancer Son   . Cancer Maternal Aunt   . Heart attack Neg Hx   . Stroke Neg Hx     Social History Social History  Substance Use Topics  . Smoking status: Never Smoker  . Smokeless tobacco: Never Used  . Alcohol use No     Comment: 2014 quit, was drinking 2  beers daily at that time     Allergies   Patient has no known allergies.   Review of Systems Review of Systems  Respiratory: Negative for shortness of breath.   Cardiovascular: Positive for chest pain and leg swelling. Negative for  palpitations.  Gastrointestinal: Negative for nausea.  Endocrine: Negative for polydipsia, polyphagia and polyuria.  Neurological: Negative for weakness and numbness.  All other systems reviewed and are negative.    Physical Exam Updated Vital Signs BP (!) 172/95 (BP Location: Right Arm)   Pulse 81   Temp 98 F (36.7 C) (Oral)   Resp 16   Wt 112.5 kg (248 lb)   SpO2 100%   BMI 36.62 kg/m   Physical Exam  Constitutional: He appears well-developed and well-nourished.  HENT:  Head: Normocephalic.  Mouth/Throat: Oropharynx is clear and moist.  Eyes: Pupils are equal, round, and reactive to light.  Neck: Normal range of motion.  Cardiovascular: Normal rate and regular rhythm.   Pulmonary/Chest: Effort normal.  Abdominal: Soft.  Musculoskeletal: He exhibits tenderness. He exhibits no edema or deformity.       Right lower leg: He exhibits tenderness. He exhibits no swelling and no edema.       Legs: Neurological: He is alert.  Skin: Skin is warm.  Nursing note and vitals reviewed.    ED Treatments / Results  Labs (all labs ordered are listed, but only abnormal results are displayed) Labs Reviewed  BASIC METABOLIC PANEL - Abnormal; Notable for the following:       Result Value   Glucose, Bld 349 (*)    All other components within normal limits  CBC - Abnormal; Notable for the following:    HCT 38.4 (*)    All other components within normal limits  D-DIMER, QUANTITATIVE (NOT AT [facility]) - Abnormal; Notable for the following:    D-Dimer, Quant 0.82 (*)    All other components within normal limits  I-STAT TROPONIN, ED    EKG  EKG Interpretation None       Radiology Dg Chest 2 View  Result Date: 07/25/2017 CLINICAL DATA:  Left-sided chest pain. EXAM: CHEST  2 VIEW COMPARISON:  None. FINDINGS: The cardiomediastinal contours are normal. The lungs are clear. Pulmonary vasculature is normal. No consolidation, pleural effusion, or pneumothorax. No acute osseous  abnormalities are seen. IMPRESSION: No acute pulmonary process. Electronically Signed   By: Rubye Oaks M.D.   On: 07/25/2017 18:42    Procedures Procedures (including critical care time)  Medications Ordered in ED Medications - No data to display   Initial Impression / Assessment and Plan / ED Course  I have reviewed the triage vital signs and the nursing notes.  Pertinent labs & imaging results that were available during my care of the patient were reviewed by me and considered in my medical decision making (see chart for details).      D Dimer + will give Lovonox and schedule for return in AM for Vascular study  Final Clinical Impressions(s) / ED Diagnoses   Final diagnoses:  Nonspecific chest pain  Suspected deep vein thrombosis (DVT)    New Prescriptions Discharge Medication List as of 07/25/2017 11:06 PM       Earley Favor, NP 07/26/17 1610    Marily Memos, MD 07/28/17 1727

## 2017-07-26 ENCOUNTER — Ambulatory Visit (HOSPITAL_COMMUNITY)
Admission: RE | Admit: 2017-07-26 | Discharge: 2017-07-26 | Disposition: A | Discharge status: Home / Self Care | Payer: Self-pay | Source: Ambulatory Visit | Attending: Emergency Medicine | Admitting: Emergency Medicine

## 2017-07-26 DIAGNOSIS — M7989 Other specified soft tissue disorders: Secondary | ICD-10-CM | POA: Insufficient documentation

## 2017-07-26 NOTE — Progress Notes (Addendum)
**  Preliminary report by tech**  Right lower extremity venous duplex complete. There is no evidence of deep or superficial vein thrombosis involving the right lower extremity. All visualized vessels appear patent and compressible. There is no evidence of a Baker's cyst on the right.  07/26/17 8:41 AM Olen CordialGreg Michaelah Credeur RVT

## 2017-08-23 ENCOUNTER — Other Ambulatory Visit: Payer: Self-pay | Admitting: Internal Medicine

## 2017-09-09 ENCOUNTER — Other Ambulatory Visit: Payer: Self-pay | Admitting: Internal Medicine

## 2017-09-28 ENCOUNTER — Other Ambulatory Visit: Payer: Self-pay | Admitting: Internal Medicine

## 2017-10-31 ENCOUNTER — Encounter (HOSPITAL_COMMUNITY): Payer: Self-pay | Admitting: Emergency Medicine

## 2017-10-31 ENCOUNTER — Emergency Department (HOSPITAL_COMMUNITY)
Admission: EM | Admit: 2017-10-31 | Discharge: 2017-10-31 | Disposition: A | Discharge status: Home / Self Care | Payer: Self-pay | Attending: Emergency Medicine | Admitting: Emergency Medicine

## 2017-10-31 DIAGNOSIS — I10 Essential (primary) hypertension: Secondary | ICD-10-CM | POA: Insufficient documentation

## 2017-10-31 DIAGNOSIS — M545 Low back pain, unspecified: Secondary | ICD-10-CM

## 2017-10-31 DIAGNOSIS — Z79899 Other long term (current) drug therapy: Secondary | ICD-10-CM | POA: Insufficient documentation

## 2017-10-31 DIAGNOSIS — E119 Type 2 diabetes mellitus without complications: Secondary | ICD-10-CM | POA: Insufficient documentation

## 2017-10-31 DIAGNOSIS — Z794 Long term (current) use of insulin: Secondary | ICD-10-CM | POA: Insufficient documentation

## 2017-10-31 DIAGNOSIS — G8929 Other chronic pain: Secondary | ICD-10-CM | POA: Insufficient documentation

## 2017-10-31 DIAGNOSIS — Z7982 Long term (current) use of aspirin: Secondary | ICD-10-CM | POA: Insufficient documentation

## 2017-10-31 MED ORDER — CYCLOBENZAPRINE HCL 10 MG PO TABS: 10.0000 mg | ORAL_TABLET | Freq: Two times a day (BID) | ORAL | 0 refills | Status: DC | PRN

## 2017-10-31 NOTE — Discharge Instructions (Signed)
Please read attached information. If you experience any new or worsening signs or symptoms please return to the emergency room for evaluation. Please follow-up with your primary care provider or specialist as discussed. Please use medication prescribed only as directed and discontinue taking if you have any concerning signs or symptoms.   °

## 2017-10-31 NOTE — ED Provider Notes (Signed)
[facility] EMERGENCY DEPARTMENT Provider Note   CSN: [id number] Arrival date & time: 10/31/17  1191     History   Chief Complaint Chief Complaint  Patient presents with  . Back Pain    HPI Patrick [name] is a 46 y.o. male.  HPI   47 year old male presents today with complaints of back pain.  Patient notes for the last 2 months he has had on and off right lower back pain.  He reports this is worse with movements, improved after warming up.  He notes using heat pack improved symptoms.  He notes from time to time he feels knots on his back that come and go.  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  He denies any distal neurological deficits.  She reports he has history of diabetes, hypertension, reports he did not take his blood pressure medication today.  Patient denies abdominal pain.  Patient notes that occasionally he has slightly foamy urine, none today.  No dysuria.   Past Medical History:  Diagnosis Date  . Diabetes mellitus without complication (HCC)   . Hyperlipidemia   . Hypertension   . Obesity     Patient Active Problem List   Diagnosis Date Noted  . Intermittent chest pain 10/31/2014  . OBESITY 07/30/2010  . Disorder of teeth and supporting structures 05/13/2009  . FOOT PAIN, RIGHT 05/13/2009  . Hyperlipidemia 02/21/2008  . HYPERTENSION, BENIGN ESSENTIAL 02/21/2008  . Diabetes mellitus without complication (HCC) 09/04/2007  . SHOULDER PAIN 09/04/2007  . PROTEINURIA 09/04/2007    Past Surgical History:  Procedure Laterality Date  . CHOLECYSTECTOMY  2000       Home Medications    Prior to Admission medications   Medication Sig Start Date End Date Taking? Authorizing Provider  amLODipine (NORVASC) 5 MG tablet Take 1 tablet (5 mg total) by mouth daily. 07/10/17   Mayo, Allyn Kenner, MD  aspirin EC 81 MG tablet Take 1 tablet (81 mg total) by mouth daily. 10/31/14   Leona Singleton, MD    cyclobenzaprine (FLEXERIL) 10 MG tablet Take 1 tablet (10 mg total) by mouth 2 (two) times daily as needed for muscle spasms. 10/31/17   Milica Gully, Tinnie Gens, PA-C  glipiZIDE (GLUCOTROL) 10 MG tablet TAKE ONE TABLET BY MOUTH TWICE DAILY BEFORE MEAL(S) 07/26/16   Mayo, Allyn Kenner, MD  glipiZIDE (GLUCOTROL) 10 MG tablet TAKE 1 TABLET BY MOUTH TWICE DAILY BEFORE  MEALS 09/29/17   Mikell, Antionette Poles, MD  glucose blood (WAVESENSE PRESTO) test strip Use as instructed - Dispense QS for up to twice daily testing. 01/02/14   Moses Manners, MD  HYDROcodone-acetaminophen (NORCO/VICODIN) 5-325 MG per tablet Take 1 tablet by mouth every 6 (six) hours as needed for moderate pain. 02/12/15   Bethann Berkshire, MD  insulin glargine (LANTUS) 100 UNIT/ML injection Inject 0.1 mLs (10 Units total) into the skin at bedtime. 09/11/15   Mayo, Allyn Kenner, MD  lisinopril-hydrochlorothiazide (PRINZIDE,ZESTORETIC) 20-25 MG tablet Take 1 tablet by mouth daily. 06/29/16   Mayo, Allyn Kenner, MD  lisinopril-hydrochlorothiazide (PRINZIDE,ZESTORETIC) 20-25 MG tablet TAKE 1 TABLET BY MOUTH ONCE DAILY 08/24/17   Mayo, Allyn Kenner, MD  metFORMIN (GLUCOPHAGE) 1000 MG tablet TAKE ONE TABLET BY MOUTH TWICE DAILY WITH  A  MEAL 05/27/16   Mayo, Allyn Kenner, MD  metFORMIN (GLUCOPHAGE) 1000 MG tablet TAKE 1 TABLET BY MOUTH TWICE DAILY WITH A MEAL 09/11/17   Mayo, Allyn Kenner, MD  rosuvastatin (CRESTOR) 20 MG  tablet Take 1 tablet (20 mg total) by mouth daily. 01/15/15   Moses MannersHensel, William A, MD    Family History Family History  Problem Relation Age of Onset  . Cancer Mother   . Diabetes Mother   . Hypertension Mother   . Diabetes Sister   . Cancer Maternal Grandmother   . Cancer Son   . Cancer Maternal Aunt   . Heart attack Neg Hx   . Stroke Neg Hx     Social History Social History   Tobacco Use  . Smoking status: Never Smoker  . Smokeless tobacco: Never Used  Substance Use Topics  . Alcohol use: No    Alcohol/week: 0.5 oz    Types: 1 drink(s) per  week    Comment: 2014 quit, was drinking 2  beers daily at that time  . Drug use: No     Allergies   Patient has no known allergies.   Review of Systems Review of Systems  All other systems reviewed and are negative.    Physical Exam Updated Vital Signs BP (!) 185/111 (BP Location: Right Arm)   Pulse 97   Temp 99.2 F (37.3 C) (Oral)   Resp 18   SpO2 100%   Physical Exam  Constitutional: He is oriented to person, place, and time. He appears well-developed and well-nourished.  HENT:  Head: Normocephalic and atraumatic.  Eyes: Conjunctivae are normal. Pupils are equal, round, and reactive to light. Right eye exhibits no discharge. Left eye exhibits no discharge. No scleral icterus.  Neck: Normal range of motion. No JVD present. No tracheal deviation present.  Pulmonary/Chest: Effort normal. No stridor.  Musculoskeletal:  No CT or L-spine tenderness palpation.  Tenderness palpation of the right lateral lumbar musculature-no abdominal tenderness to palpation-distal sensation strength and motor function intact.  Neurological: He is alert and oriented to person, place, and time. Coordination normal.  Psychiatric: He has a normal mood and affect. His behavior is normal. Judgment and thought content normal.  Nursing note and vitals reviewed.    ED Treatments / Results  Labs (all labs ordered are listed, but only abnormal results are displayed) Labs Reviewed - No data to display  EKG  EKG Interpretation None       Radiology No results found.  Procedures Procedures (including critical care time)  Medications Ordered in ED Medications - No data to display   Initial Impression / Assessment and Plan / ED Course  I have reviewed the triage vital signs and the nursing notes.  Pertinent labs & imaging results that were available during my care of the patient were reviewed by me and considered in my medical decision making (see chart for details).      Final  Clinical Impressions(s) / ED Diagnoses   Final diagnoses:  Chronic right-sided low back pain without sciatica    Labs:   Imaging:  Consults:  Therapeutics:  Discharge Meds: flexeril  Assessment/Plan: 46 year old male presents today with back pain.  This is been 2 months in duration, this appears to be muscular in nature worse with movements worse with palpation, no red flags, no abdominal pain.  Patient was hypertensive he reports that he does not take his blood pressure medication.  After further discussion it sounds like his blood sugar has been elevated to.  Patient is highly encouraged to follow-up with his primary care provider immediately for ongoing management of hypertension and uncontrolled diabetes.  Patient instructed to take his blood pressure medication recheck blood pressure.  Patient  has no signs of endorgan damage no need for further evaluation or management here in the knee.  Symptomatic care instructions given strict return precautions given.  Patient verbalized understanding and agreement to today's plan had no further questions or concerns at time of discharge.      ED Discharge Orders        Ordered    cyclobenzaprine (FLEXERIL) 10 MG tablet  2 times daily PRN     10/31/17 1026       Eyvonne MechanicHedges, Dashanna Kinnamon, PA-C 10/31/17 1045    Mabe, Latanya MaudlinMartha L, MD 10/31/17 1046

## 2017-10-31 NOTE — ED Triage Notes (Signed)
Pt to ER for right lateral back pain onset for approximately one month. Pt denies radiation. Pt denies numbness. Ambulatory without difficulty.

## 2017-11-24 ENCOUNTER — Other Ambulatory Visit: Payer: Self-pay | Admitting: Internal Medicine

## 2017-12-01 ENCOUNTER — Other Ambulatory Visit: Payer: Self-pay | Admitting: Internal Medicine

## 2017-12-07 ENCOUNTER — Other Ambulatory Visit: Payer: Self-pay | Admitting: *Deleted

## 2017-12-07 MED ORDER — METFORMIN HCL 1000 MG PO TABS: 1000.0000 mg | ORAL_TABLET | Freq: Two times a day (BID) | ORAL | 0 refills | Status: DC

## 2017-12-07 NOTE — Telephone Encounter (Signed)
Patient very overdue for an appointment. Has not been seen in almost 1.5 years. Needs appointment if he wants any additional refills. Can we call him to schedule an appointment?

## 2017-12-08 NOTE — Telephone Encounter (Signed)
Attempted to contact pt to inform of need of apt. Pts phone just rang and rang... Will try again later.

## 2017-12-22 ENCOUNTER — Other Ambulatory Visit: Payer: Self-pay | Admitting: Internal Medicine

## 2017-12-26 ENCOUNTER — Other Ambulatory Visit: Payer: Self-pay | Admitting: Internal Medicine

## 2018-01-11 ENCOUNTER — Other Ambulatory Visit: Payer: Self-pay | Admitting: Internal Medicine

## 2018-01-11 NOTE — Telephone Encounter (Signed)
Spoke with patient regarding his need for an appointment.  Made a diabetes follow up for 01-22-18, with Dr. Talbert ForestShirley, PCP was not available.  Will forward to MD for 1 more refill to last until his appointment. Kanetra Ho,CMA

## 2018-01-12 NOTE — Telephone Encounter (Signed)
Thank you for scheduling his appointment!

## 2018-01-22 ENCOUNTER — Ambulatory Visit: Payer: Self-pay | Admitting: Family Medicine

## 2018-01-31 ENCOUNTER — Ambulatory Visit: Payer: Self-pay | Admitting: Family Medicine

## 2018-02-02 ENCOUNTER — Other Ambulatory Visit: Payer: Self-pay

## 2018-02-02 ENCOUNTER — Telehealth: Payer: Self-pay | Admitting: Family Medicine

## 2018-02-02 ENCOUNTER — Encounter: Payer: Self-pay | Admitting: Family Medicine

## 2018-02-02 ENCOUNTER — Ambulatory Visit (INDEPENDENT_AMBULATORY_CARE_PROVIDER_SITE_OTHER): Payer: Self-pay | Admitting: Family Medicine

## 2018-02-02 VITALS — BP 128/68 | HR 99 | Temp 98.5°F | Ht 69.0 in | Wt 246.4 lb

## 2018-02-02 DIAGNOSIS — I10 Essential (primary) hypertension: Secondary | ICD-10-CM

## 2018-02-02 DIAGNOSIS — G8929 Other chronic pain: Secondary | ICD-10-CM

## 2018-02-02 DIAGNOSIS — R800 Isolated proteinuria: Secondary | ICD-10-CM

## 2018-02-02 DIAGNOSIS — B351 Tinea unguium: Secondary | ICD-10-CM

## 2018-02-02 DIAGNOSIS — E7800 Pure hypercholesterolemia, unspecified: Secondary | ICD-10-CM

## 2018-02-02 DIAGNOSIS — E119 Type 2 diabetes mellitus without complications: Secondary | ICD-10-CM

## 2018-02-02 DIAGNOSIS — R109 Unspecified abdominal pain: Secondary | ICD-10-CM

## 2018-02-02 LAB — POCT URINALYSIS DIP (MANUAL ENTRY)
BILIRUBIN UA: NEGATIVE
Glucose, UA: 500 mg/dL — AB
Ketones, POC UA: NEGATIVE mg/dL
LEUKOCYTES UA: NEGATIVE
Nitrite, UA: NEGATIVE
PH UA: 5.5 (ref 5.0–8.0)
SPEC GRAV UA: 1.025 (ref 1.010–1.025)
Urobilinogen, UA: 0.2 E.U./dL

## 2018-02-02 LAB — POCT UA - MICROSCOPIC ONLY

## 2018-02-02 LAB — POCT GLYCOSYLATED HEMOGLOBIN (HGB A1C): HEMOGLOBIN A1C: 10.4

## 2018-02-02 MED ORDER — INSULIN DEGLUDEC 100 UNIT/ML ~~LOC~~ SOPN: 10.0000 [IU] | PEN_INJECTOR | Freq: Every day | SUBCUTANEOUS | 12 refills | Status: DC

## 2018-02-02 MED ORDER — INSULIN DEGLUDEC 100 UNIT/ML ~~LOC~~ SOPN: 10.0000 [IU] | PEN_INJECTOR | Freq: Every day | SUBCUTANEOUS | 10 refills | Status: DC

## 2018-02-02 MED ORDER — TERBINAFINE HCL 250 MG PO TABS: 250.0000 mg | ORAL_TABLET | Freq: Every day | ORAL | 0 refills | Status: DC

## 2018-02-02 MED ORDER — INSULIN DEGLUDEC 100 UNIT/ML ~~LOC~~ SOPN: 10.0000 [IU] | PEN_INJECTOR | Freq: Every day | SUBCUTANEOUS | 0 refills | Status: DC

## 2018-02-02 NOTE — Progress Notes (Signed)
Subjective:    Patient ID: Patrick [name], male    DOB: 05/28/1971, 47 y.o.   MRN: [id number]   CC: diabetes management  HPI:  Diabetes:  Last A1c 10.4 on 06/29/2016 Taking medications: metformin 1000 mg BID, glipizide 10 mg BID On Aspirin, not taking statin Last eye exam: maybe 1-2 years ago Last foot exam: will perform today ROS: denies fever, chills, dizziness, diaphoresis, LOC, polyuria, polydipsia  Hypertension: - Medications: lisinopril-HCTZ 20-25mg  daily, amlodipine 5 mg daily - Compliance: yes - Checking BP at home: no - Denies any SOB, CP, vision changes, LE edema, medication SEs, or symptoms of hypotension - Diet: patient working on trying to have better diabetic diet and low sodium, encouraged to cut down on the pizza, patient is drinking only waters now - Exercise: does not regularly exercise- educated on importance  Back Pain: just woke up with it about 3 months ago - From hip on right to back- flank area - Turning and bending down make it worse - laying in bed, has to lay a certain way - for about 3 months- worsening - norco helped some, aleve helps- does not take everyday - heating pad helps - no burning on urination or blood in urine  Smoking status reviewed  Review of Systems: Per HPI   Patient Active Problem List   Diagnosis Date Noted  . Onychomycosis 02/05/2018  . Chronic right flank pain 02/05/2018  . Intermittent chest pain 10/31/2014  . OBESITY 07/30/2010  . Disorder of teeth and supporting structures 05/13/2009  . Hyperlipidemia 02/21/2008  . HYPERTENSION, BENIGN ESSENTIAL 02/21/2008  . Diabetes mellitus without complication (HCC) 09/04/2007  . PROTEINURIA 09/04/2007     Objective:  BP 128/68   Pulse 99   Temp 98.5 F (36.9 C) (Oral)   Ht 5\' 9"  (1.753 m)   Wt 246 lb 6.4 oz (111.8 kg)   SpO2 99%   BMI 36.39 kg/m  Vitals and nursing note reviewed  General: NAD, pleasant Cardiac: RRR, normal heart sounds, no murmurs Respiratory:  CTAB, normal effort Abdomen: soft, nontender, nondistended. Bowel sounds present Extremities: no edema or cyanosis. WWP. Skin: warm and dry, no rashes noted Neuro: alert and oriented, no focal deficits Psych: normal affect  Diabetic Foot Exam - Simple   Simple Foot Form Diabetic Foot exam was performed with the following findings:  Yes 02/02/2018 10:39 AM  Visual Inspection No deformities, no ulcerations, no other skin breakdown bilaterally:  Yes Sensation Testing See comments:  Yes Pulse Check Posterior Tibialis and Dorsalis pulse intact bilaterally:  Yes Comments Intact to touch BL Onychomycosis of great toe BL    No results for input(s): WBC, HGB, HCT, PLT in the last 168 hours. Recent Labs  Lab 02/02/18 1147  NA 139  K 4.5  CL 99  CO2 23  BUN 15  CREATININE 1.32*  CALCIUM 9.9  PROT 7.7  BILITOT 0.2  ALKPHOS 62  ALT 21  AST 15  GLUCOSE 298*  A1c: 10.4  Assessment & Plan:    Onychomycosis Present on BL great toes, will start terbinafine for 3 months. Patient informed could take 6-9 months prior to seeing results. Obtained CMP for baseline LFT's, which were normal.   Diabetes mellitus without complication Uncontrolled. A1c 10.4 today, stable from 2017 at last office visit. Currently taking glipizide and metofrming, not taking his Lantus 10 U. - Patient given Evaristo Buryresiba sample here [facility]. - Advised patient to check blood sugars at home,  had not been previously - Diabetic foot exam performed today only showing onychomycosis - Referral placed for opthalmology - Patient to return in 3 months and work on diet modifications   HYPERTENSION, BENIGN ESSENTIAL BP 128/68 in office. Controlled. Pt taking Lisinopril-HCTZ and Norvasc 5 mg.  - Continue with current medications  Chronic right flank pain Pt with 3 months of pain. Appears to be msk related given history and exam findings. UA with small amount of RBC's seen and proteinuria. Pt  to return Monday morning for microalbumin/ creatinine ratio. Encouraged to continue using heating pad.    [place] Keena Dinse, DO Family Medicine Resident PGY-1

## 2018-02-02 NOTE — Patient Instructions (Signed)
Thank you for coming to see me today. It was a pleasure! Today we talked about:   Your diabetes. Please take 10 U nightly of your [place]. [facility]. Continue drinking water, you're doing great!  Your toenail fungus. I have sent Lamisil to your pharmacy. I will check your liver function to make sure there are no problems prior to starting this.  Your back pain. I have gotten a urine sample and blood test to test your kidneys to ensure that this is not caused by these things. I will call you with the results. Please continue using the heating pad and aleve as needed and discussed.   Please follow-up with your doctor in 1 month or sooner if needed.  If you have any questions or concerns, please do not hesitate to call the office at [phone].  Take Care,   [place] Latifah Padin, DO    Diet Recommendations for Diabetes   Carbohydrate includes starch, sugar, and fiber.  Of these, only sugar and starch raise blood glucose.  (Fiber is found in fruits, vegetables [especially skin, seeds, and stalks] and whole grains.)   Starchy (carb) foods: Bread, rice, pasta, potatoes, corn, cereal, grits, crackers, bagels, muffins, all baked goods.  (Fruit, milk, and yogurt also have carbohydrate, but most of these foods will not spike your blood sugar as most starchy foods will.)  A few fruits do cause high blood sugars; use small portions of bananas (limit to 1/2 at a time), grapes, watermelon, oranges, and most tropical fruits.   Protein foods: Meat, fish, poultry, eggs, dairy foods, and beans such as pinto and kidney beans (beans also provide carbohydrate).   1. Eat at least REAL 3 meals and 1-2 snacks per day. Never go more than 4-5 hours while awake without eating. Eat breakfast within the first hour of getting up.   2. Limit starchy foods to TWO per meal and ONE per snack. ONE portion of a starchy  food is equal to the following:   - ONE slice of bread (or its  equivalent, such as half of a hamburger bun).   - 1/2 cup of a "scoopable" starchy food such as potatoes or rice.   - 15 grams of Total Carbohydrate as shown on food label.  3. Include at every meal: a protein food, a carb food, and vegetables and/or fruit.   - Obtain twice the volume of veg's as protein or carbohydrate foods for both lunch and dinner.   - Fresh or frozen veg's are best.   - Keep frozen veg's on hand for a quick vegetable serving.

## 2018-02-02 NOTE — Telephone Encounter (Signed)
Patient called with results from this morning's visit and instructed to come in Monday morning or when able to leave urine sample to perform microalbumin/creatinine ratio.

## 2018-02-03 LAB — COMPREHENSIVE METABOLIC PANEL
A/G RATIO: 1.4 (ref 1.2–2.2)
ALT: 21 IU/L (ref 0–44)
AST: 15 IU/L (ref 0–40)
Albumin: 4.5 g/dL (ref 3.5–5.5)
Alkaline Phosphatase: 62 IU/L (ref 39–117)
BUN/Creatinine Ratio: 11 (ref 9–20)
BUN: 15 mg/dL (ref 6–24)
Bilirubin Total: 0.2 mg/dL (ref 0.0–1.2)
CALCIUM: 9.9 mg/dL (ref 8.7–10.2)
CHLORIDE: 99 mmol/L (ref 96–106)
CO2: 23 mmol/L (ref 20–29)
Creatinine, Ser: 1.32 mg/dL — ABNORMAL HIGH (ref 0.76–1.27)
GFR calc Af Amer: 74 mL/min/{1.73_m2} (ref >59)
GFR, EST NON AFRICAN AMERICAN: 64 mL/min/{1.73_m2} (ref >59)
GLUCOSE: 298 mg/dL — AB (ref 65–99)
Globulin, Total: 3.2 g/dL (ref 1.5–4.5)
POTASSIUM: 4.5 mmol/L (ref 3.5–5.2)
Sodium: 139 mmol/L (ref 134–144)
Total Protein: 7.7 g/dL (ref 6.0–8.5)

## 2018-02-05 DIAGNOSIS — G8929 Other chronic pain: Secondary | ICD-10-CM | POA: Insufficient documentation

## 2018-02-05 DIAGNOSIS — B351 Tinea unguium: Secondary | ICD-10-CM | POA: Insufficient documentation

## 2018-02-05 DIAGNOSIS — R109 Unspecified abdominal pain: Secondary | ICD-10-CM

## 2018-02-05 NOTE — Assessment & Plan Note (Signed)
Pt with 3 months of pain. Appears to be msk related given history and exam findings. UA with small amount of RBC's seen and proteinuria. Pt to return Monday morning for microalbumin/ creatinine ratio. Encouraged to continue using heating pad.

## 2018-02-05 NOTE — Assessment & Plan Note (Signed)
Uncontrolled. A1c 10.4 today, stable from 2017 at last office visit. Currently taking glipizide and metofrming, not taking his Lantus 10 U. - Patient given Evaristo Buryresiba sample here [facility]. - Advised patient to check blood sugars at home, had not been previously - Diabetic foot exam performed today only showing onychomycosis - Referral placed for opthalmology - Patient to return in 3 months and work on diet modifications

## 2018-02-05 NOTE — Assessment & Plan Note (Signed)
BP 128/68 in office. Controlled. Pt taking Lisinopril-HCTZ and Norvasc 5 mg.  - Continue with current medications

## 2018-02-05 NOTE — Assessment & Plan Note (Signed)
Present on BL great toes, will start terbinafine for 3 months. Patient informed could take 6-9 months prior to seeing results. Obtained CMP for baseline LFT's, which were normal.

## 2018-02-15 ENCOUNTER — Encounter (HOSPITAL_COMMUNITY): Payer: Self-pay | Admitting: Emergency Medicine

## 2018-02-15 ENCOUNTER — Emergency Department (HOSPITAL_COMMUNITY)
Admission: EM | Admit: 2018-02-15 | Discharge: 2018-02-15 | Disposition: A | Discharge status: Home / Self Care | Payer: Self-pay | Attending: Emergency Medicine | Admitting: Emergency Medicine

## 2018-02-15 DIAGNOSIS — Z79899 Other long term (current) drug therapy: Secondary | ICD-10-CM | POA: Insufficient documentation

## 2018-02-15 DIAGNOSIS — G8929 Other chronic pain: Secondary | ICD-10-CM | POA: Insufficient documentation

## 2018-02-15 DIAGNOSIS — M545 Low back pain: Secondary | ICD-10-CM | POA: Insufficient documentation

## 2018-02-15 DIAGNOSIS — E119 Type 2 diabetes mellitus without complications: Secondary | ICD-10-CM | POA: Insufficient documentation

## 2018-02-15 DIAGNOSIS — Z7984 Long term (current) use of oral hypoglycemic drugs: Secondary | ICD-10-CM | POA: Insufficient documentation

## 2018-02-15 DIAGNOSIS — Z7982 Long term (current) use of aspirin: Secondary | ICD-10-CM | POA: Insufficient documentation

## 2018-02-15 DIAGNOSIS — M546 Pain in thoracic spine: Secondary | ICD-10-CM | POA: Insufficient documentation

## 2018-02-15 DIAGNOSIS — I10 Essential (primary) hypertension: Secondary | ICD-10-CM | POA: Insufficient documentation

## 2018-02-15 LAB — COMPREHENSIVE METABOLIC PANEL
ALBUMIN: 4 g/dL (ref 3.5–5.0)
ALT: 22 U/L (ref 17–63)
AST: 22 U/L (ref 15–41)
Alkaline Phosphatase: 46 U/L (ref 38–126)
Anion gap: 8 (ref 5–15)
BILIRUBIN TOTAL: 0.4 mg/dL (ref 0.3–1.2)
BUN: 13 mg/dL (ref 6–20)
CHLORIDE: 102 mmol/L (ref 101–111)
CO2: 29 mmol/L (ref 22–32)
CREATININE: 1.27 mg/dL — AB (ref 0.61–1.24)
Calcium: 9.6 mg/dL (ref 8.9–10.3)
GFR calc Af Amer: >60 mL/min (ref >60)
GFR calc non Af Amer: >60 mL/min (ref >60)
GLUCOSE: 280 mg/dL — AB (ref 65–99)
POTASSIUM: 4.1 mmol/L (ref 3.5–5.1)
Sodium: 139 mmol/L (ref 135–145)
TOTAL PROTEIN: 7.6 g/dL (ref 6.5–8.1)

## 2018-02-15 LAB — CBC
HEMATOCRIT: 40.2 % (ref 39.0–52.0)
Hemoglobin: 14.3 g/dL (ref 13.0–17.0)
MCH: 31.4 pg (ref 26.0–34.0)
MCHC: 35.6 g/dL (ref 30.0–36.0)
MCV: 88.2 fL (ref 78.0–100.0)
Platelets: 283 10*3/uL (ref 150–400)
RBC: 4.56 MIL/uL (ref 4.22–5.81)
RDW: 12.7 % (ref 11.5–15.5)
WBC: 7.3 10*3/uL (ref 4.0–10.5)

## 2018-02-15 LAB — LIPASE, BLOOD: Lipase: 26 U/L (ref 11–51)

## 2018-02-15 MED ORDER — METHOCARBAMOL 750 MG PO TABS: 750.0000 mg | ORAL_TABLET | Freq: Three times a day (TID) | ORAL | 0 refills | Status: DC | PRN

## 2018-02-15 MED ORDER — MELOXICAM 7.5 MG PO TABS: 7.5000 mg | ORAL_TABLET | Freq: Every day | ORAL | 0 refills | Status: AC

## 2018-02-15 NOTE — ED Provider Notes (Signed)
[facility]-EMERGENCY DEPT Provider Note   CSN: [id number] Arrival date & time: 02/15/18  1626     History   Chief Complaint Chief Complaint  Patient presents with  . Abdominal Pain  . Back Pain    HPI Patrick [name] is a 47 y.o. male with a history of DM, HTN, who presents today for evaluation of back pain /left side pain for 6 months.  He reports that it has been acutely worse over the past few days.  He has been seen for this in the past.  He reports his pain is worse with movement, and palpation.  It is made better by sitting still.  His pain does not radiate or move.  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  He denies dysuria, hematuria, urgency or frequency.  He reports he has not been drinking much water today and that he has been eating high carb food.   HPI  Past Medical History:  Diagnosis Date  . Diabetes mellitus without complication (HCC)   . Hyperlipidemia   . Hypertension   . Obesity     Patient Active Problem List   Diagnosis Date Noted  . Onychomycosis 02/05/2018  . Chronic right flank pain 02/05/2018  . Intermittent chest pain 10/31/2014  . OBESITY 07/30/2010  . Disorder of teeth and supporting structures 05/13/2009  . Hyperlipidemia 02/21/2008  . HYPERTENSION, BENIGN ESSENTIAL 02/21/2008  . Diabetes mellitus without complication (HCC) 09/04/2007  . PROTEINURIA 09/04/2007    Past Surgical History:  Procedure Laterality Date  . CHOLECYSTECTOMY  2000       Home Medications    Prior to Admission medications   Medication Sig Start Date End Date Taking? Authorizing Provider  amLODipine (NORVASC) 5 MG tablet Take 1 tablet (5 mg total) by mouth daily. 07/10/17  Yes Mayo, Allyn Kenner, MD  aspirin EC 81 MG tablet Take 1 tablet (81 mg total) by mouth daily. 10/31/14  Yes Leona Singleton, MD  glipiZIDE (GLUCOTROL) 10 MG tablet Take 1 tablet (10 mg total) by mouth 2 (two) times daily before a  meal. No additional refills without appointment. 12/28/17  Yes Mayo, Allyn Kenner, MD  insulin degludec (TRESIBA FLEXTOUCH) 100 UNIT/ML SOPN FlexTouch Pen Inject 0.1 mLs (10 Units total) into the skin daily at 10 pm. 02/02/18  Yes Hensel, Santiago Bumpers, MD  lisinopril-hydrochlorothiazide (PRINZIDE,ZESTORETIC) 20-25 MG tablet Take 1 tablet by mouth daily. 06/29/16  Yes Mayo, Allyn Kenner, MD  metFORMIN (GLUCOPHAGE) 1000 MG tablet TAKE 1 TABLET BY MOUTH TWICE DAILY WITH  A  MEAL. 01/12/18  Yes Mayo, Allyn Kenner, MD  glucose blood (WAVESENSE PRESTO) test strip Use as instructed - Dispense QS for up to twice daily testing. 01/02/14   Moses Manners, MD  insulin degludec (TRESIBA FLEXTOUCH) 100 UNIT/ML SOPN FlexTouch Pen Inject 0.1 mLs (10 Units total) into the skin daily at 10 pm. 02/02/18   Shirley, [place], DO  meloxicam (MOBIC) 7.5 MG tablet Take 1 tablet (7.5 mg total) by mouth daily for 7 days. 02/15/18 02/22/18  Cristina Gong, PA-C  methocarbamol (ROBAXIN) 750 MG tablet Take 1-2 tablets (750-1,500 mg total) by mouth 3 (three) times daily as needed for muscle spasms. 02/15/18   Cristina Gong, PA-C  rosuvastatin (CRESTOR) 20 MG tablet Take 1 tablet (20 mg total) by mouth daily. Patient not taking: Reported on 02/15/2018 01/15/15   Moses Manners, MD  terbinafine (LAMISIL) 250 MG tablet Take 1 tablet (250  mg total) by mouth daily. Patient not taking: Reported on 02/15/2018 02/02/18   Shirley, [place], DO    Family History Family History  Problem Relation Age of Onset  . Cancer Mother   . Diabetes Mother   . Hypertension Mother   . Diabetes Sister   . Cancer Maternal Grandmother   . Cancer Son   . Cancer Maternal Aunt   . Heart attack Neg Hx   . Stroke Neg Hx     Social History Social History   Tobacco Use  . Smoking status: Never Smoker  . Smokeless tobacco: Never Used  Substance Use Topics  . Alcohol use: No    Alcohol/week: 0.5 oz    Types: 1 drink(s) per week    Comment: 2014 quit, was  drinking 2  beers daily at that time  . Drug use: No     Allergies   Patient has no known allergies.   Review of Systems Review of Systems  Constitutional: Negative for chills and fever.  HENT: Negative for ear pain and sore throat.   Eyes: Negative for pain and visual disturbance.  Respiratory: Negative for cough, chest tightness and shortness of breath.   Cardiovascular: Negative for chest pain and palpitations.  Gastrointestinal: Negative for abdominal pain and vomiting.  Genitourinary: Positive for flank pain. Negative for decreased urine volume, dysuria, frequency, hematuria and urgency.  Musculoskeletal: Negative for arthralgias and back pain.  Skin: Negative for color change and rash.  Neurological: Negative for seizures, syncope and headaches.  All other systems reviewed and are negative.    Physical Exam Updated Vital Signs BP (!) 159/96 (BP Location: Left Arm)   Pulse 84   Temp 99.3 F (37.4 C) (Oral)   Resp 20   SpO2 99%   Physical Exam  Constitutional: He is oriented to person, place, and time. He appears well-developed and well-nourished.  Non-toxic appearance. No distress.  HENT:  Head: Normocephalic and atraumatic.  Eyes: Conjunctivae are normal. Right eye exhibits no discharge. Left eye exhibits no discharge. No scleral icterus.  Neck: Normal range of motion.  Cardiovascular: Normal rate, regular rhythm and intact distal pulses.  Pulmonary/Chest: Effort normal and breath sounds normal. No stridor. No respiratory distress.  Abdominal: Soft. Normal appearance and bowel sounds are normal. He exhibits no distension. There is no tenderness. There is no rigidity, no rebound, no guarding, no CVA tenderness, no tenderness at McBurney's point and negative Murphy's sign.  Musculoskeletal: He exhibits no edema or deformity.  Diffuse TTP along right sided thoracic paraspinal muscles.  Palpation over this area both re-creates and exacerbates his pain.  His pain  continues along right sided back and down to lumbar paraspinal muscles.  Muscles in this area feel tight, consistent with spasm.  Spine palpated there are no step-offs or deformities.  No crepitus.  Right-sided lower lateral ribs are without crepitus, paradoxical movement, or deformities.  Neurological: He is alert and oriented to person, place, and time. He exhibits normal muscle tone.  Skin: Skin is warm and dry. He is not diaphoretic.  Psychiatric: He has a normal mood and affect. His behavior is normal.  Nursing note and vitals reviewed.    ED Treatments / Results  Labs (all labs ordered are listed, but only abnormal results are displayed) Labs Reviewed  COMPREHENSIVE METABOLIC PANEL - Abnormal; Notable for the following components:      Result Value   Glucose, Bld 280 (*)    Creatinine, Ser 1.27 (*)    All  other components within normal limits  LIPASE, BLOOD  CBC  URINALYSIS, ROUTINE W REFLEX MICROSCOPIC    EKG  EKG Interpretation None       Radiology No results found.  Procedures Procedures (including critical care time)  Medications Ordered in ED Medications - No data to display   Initial Impression / Assessment and Plan / ED Course  I have reviewed the triage vital signs and the nursing notes.  Pertinent labs & imaging results that were available during my care of the patient were reviewed by me and considered in my medical decision making (see chart for details).    Patient presents today for evaluation of acute on chronic right-sided thoracic and lower back pain.  No recent injury prior to this exacerbation.  He has previously been seen for this and evaluated by his PCP.  He reports that his Aleve is not strong enough and is requesting something stronger.  He will be given prescription for Mobic, along with Robaxin.  He was instructed to follow-up with his primary care provider.  Based on the duration of patient's pain being over 6 months lack of fevers, no  personal history of cancer or IV drug use with normal GFR I have a low suspicion for a serious or life-threatening cause.  He was given return precautions, and states his understanding.    Final Clinical Impressions(s) / ED Diagnoses   Final diagnoses:  Chronic right-sided thoracic back pain  Chronic right-sided low back pain without sciatica    ED Discharge Orders        Ordered    meloxicam (MOBIC) 7.5 MG tablet  Daily     02/15/18 2136    methocarbamol (ROBAXIN) 750 MG tablet  3 times daily PRN     02/15/18 2136       Cristina Gong, PA-C 02/16/18 1143    Linwood Dibbles, MD 02/16/18 1341

## 2018-02-15 NOTE — ED Notes (Signed)
Pt declined DC vitals 

## 2018-02-15 NOTE — ED Triage Notes (Signed)
Patient c/o right lateral abd pain that radiates to back x month. Denies any n/v/d or urinary problems or injuries. Pt reports that pain is worse with movement.

## 2018-02-15 NOTE — Discharge Instructions (Signed)
I have given you a prescription for Mobic (meloxicam) today.  Mobic is a NSAID medication and you should not take it with other NSAIDs.  Examples of other NSAIDS include motrin, ibuprofen, aleve, naproxen, and Voltaren.  Please monitor your bowel movements for dark, tarry, sticky stools. If you have any bowel movements like this you need to stop taking mobic and call your doctor as this may represent a stomach ulcer from taking NSAIDS.    Please take Tylenol (acetaminophen) to relieve your pain.  You may take tylenol, up to 1,000 mg (two extra strength pills).  Do not take more than 3,000 mg tylenol in a 24 hour period.  Please check all medication labels as many medications such as pain and cold medications may contain tylenol. Please do not drink alcohol while taking this medication.   You are being prescribed a medication which may make you sleepy. For 24 hours after one dose please do not drive, operate heavy machinery, care for a small child with out another adult present, or perform any activities that may cause harm to you or someone else if you were to fall asleep or be impaired.

## 2018-02-21 ENCOUNTER — Other Ambulatory Visit: Payer: Self-pay

## 2018-02-21 MED ORDER — GLIPIZIDE 10 MG PO TABS: 10.0000 mg | ORAL_TABLET | Freq: Two times a day (BID) | ORAL | 0 refills | Status: DC

## 2018-02-26 ENCOUNTER — Other Ambulatory Visit: Payer: Self-pay | Admitting: *Deleted

## 2018-02-27 MED ORDER — METFORMIN HCL 1000 MG PO TABS: ORAL_TABLET | ORAL | 0 refills | Status: DC

## 2018-03-02 ENCOUNTER — Other Ambulatory Visit: Payer: Self-pay | Admitting: Internal Medicine

## 2018-03-02 DIAGNOSIS — I10 Essential (primary) hypertension: Secondary | ICD-10-CM

## 2018-03-02 NOTE — Telephone Encounter (Signed)
Pt needs refill on his lisinopril. Please advise

## 2018-03-05 MED ORDER — LISINOPRIL-HYDROCHLOROTHIAZIDE 20-25 MG PO TABS: 1.0000 | ORAL_TABLET | Freq: Every day | ORAL | 6 refills | Status: DC

## 2018-03-20 ENCOUNTER — Other Ambulatory Visit: Payer: Self-pay | Admitting: *Deleted

## 2018-03-21 MED ORDER — GLIPIZIDE 10 MG PO TABS: 10.0000 mg | ORAL_TABLET | Freq: Two times a day (BID) | ORAL | 0 refills | Status: DC

## 2018-03-21 NOTE — Telephone Encounter (Signed)
Fax received from pharmacy.  Yolette Hastings,CMA

## 2018-05-18 ENCOUNTER — Other Ambulatory Visit: Payer: Self-pay | Admitting: Internal Medicine

## 2018-06-11 ENCOUNTER — Other Ambulatory Visit: Payer: Self-pay | Admitting: Internal Medicine

## 2018-06-11 NOTE — Telephone Encounter (Signed)
Pt contacted and diabetes apt scheduled for 7/23 with pcp. Pt did say that he never picked up his insulin due to the 300 dollar cost. Pt does not have insurance. Anyway we can give him samples of insulins until he can get to apt to discuss further? Not sure if we have [place] tho.

## 2018-06-11 NOTE — Telephone Encounter (Signed)
Yes, that would be great. Thank you.

## 2018-06-11 NOTE — Telephone Encounter (Signed)
Refill given, please call patient and have him make a follow up appointment for diabetes. Thanks!

## 2018-06-15 NOTE — Telephone Encounter (Signed)
Sample medication Tresiba left for patient in the med room refrigerator.   Lot: [id number] EX: 05/11/2020

## 2018-06-22 ENCOUNTER — Other Ambulatory Visit: Payer: Self-pay

## 2018-06-22 ENCOUNTER — Emergency Department (HOSPITAL_BASED_OUTPATIENT_CLINIC_OR_DEPARTMENT_OTHER): Payer: Self-pay

## 2018-06-22 ENCOUNTER — Encounter (HOSPITAL_COMMUNITY): Payer: Self-pay | Admitting: *Deleted

## 2018-06-22 ENCOUNTER — Emergency Department (HOSPITAL_COMMUNITY): Payer: Self-pay

## 2018-06-22 ENCOUNTER — Emergency Department (HOSPITAL_COMMUNITY)
Admission: EM | Admit: 2018-06-22 | Discharge: 2018-06-22 | Disposition: A | Discharge status: Home / Self Care | Payer: Self-pay | Attending: Emergency Medicine | Admitting: Emergency Medicine

## 2018-06-22 DIAGNOSIS — Z79899 Other long term (current) drug therapy: Secondary | ICD-10-CM | POA: Insufficient documentation

## 2018-06-22 DIAGNOSIS — Z794 Long term (current) use of insulin: Secondary | ICD-10-CM | POA: Insufficient documentation

## 2018-06-22 DIAGNOSIS — M79609 Pain in unspecified limb: Secondary | ICD-10-CM

## 2018-06-22 DIAGNOSIS — R200 Anesthesia of skin: Secondary | ICD-10-CM

## 2018-06-22 DIAGNOSIS — R208 Other disturbances of skin sensation: Secondary | ICD-10-CM | POA: Insufficient documentation

## 2018-06-22 DIAGNOSIS — E119 Type 2 diabetes mellitus without complications: Secondary | ICD-10-CM | POA: Insufficient documentation

## 2018-06-22 DIAGNOSIS — R609 Edema, unspecified: Secondary | ICD-10-CM

## 2018-06-22 DIAGNOSIS — I10 Essential (primary) hypertension: Secondary | ICD-10-CM | POA: Insufficient documentation

## 2018-06-22 DIAGNOSIS — Z7982 Long term (current) use of aspirin: Secondary | ICD-10-CM | POA: Insufficient documentation

## 2018-06-22 DIAGNOSIS — M79651 Pain in right thigh: Secondary | ICD-10-CM | POA: Insufficient documentation

## 2018-06-22 DIAGNOSIS — R600 Localized edema: Secondary | ICD-10-CM | POA: Insufficient documentation

## 2018-06-22 LAB — BASIC METABOLIC PANEL
Anion gap: 10 (ref 5–15)
BUN: 22 mg/dL — ABNORMAL HIGH (ref 6–20)
CALCIUM: 9.4 mg/dL (ref 8.9–10.3)
CO2: 27 mmol/L (ref 22–32)
CREATININE: 1.4 mg/dL — AB (ref 0.61–1.24)
Chloride: 101 mmol/L (ref 98–111)
GFR calc Af Amer: >60 mL/min (ref >60)
GFR, EST NON AFRICAN AMERICAN: 58 mL/min — AB (ref >60)
Glucose, Bld: 262 mg/dL — ABNORMAL HIGH (ref 70–99)
Potassium: 4 mmol/L (ref 3.5–5.1)
SODIUM: 138 mmol/L (ref 135–145)

## 2018-06-22 LAB — CBC WITH DIFFERENTIAL/PLATELET
BASOS PCT: 1 %
Basophils Absolute: 0 10*3/uL (ref 0.0–0.1)
EOS ABS: 0.4 10*3/uL (ref 0.0–0.7)
Eosinophils Relative: 6 %
HCT: 45.2 % (ref 39.0–52.0)
HEMOGLOBIN: 15.5 g/dL (ref 13.0–17.0)
Lymphocytes Relative: 44 %
Lymphs Abs: 3.4 10*3/uL (ref 0.7–4.0)
MCH: 30.3 pg (ref 26.0–34.0)
MCHC: 34.3 g/dL (ref 30.0–36.0)
MCV: 88.5 fL (ref 78.0–100.0)
Monocytes Absolute: 0.4 10*3/uL (ref 0.1–1.0)
Monocytes Relative: 6 %
Neutro Abs: 3.2 10*3/uL (ref 1.7–7.7)
Neutrophils Relative %: 43 %
Platelets: 242 10*3/uL (ref 150–400)
RBC: 5.11 MIL/uL (ref 4.22–5.81)
RDW: 12.7 % (ref 11.5–15.5)
WBC: 7.5 10*3/uL (ref 4.0–10.5)

## 2018-06-22 MED ORDER — LISINOPRIL 20 MG PO TABS
20.0000 mg | ORAL_TABLET | Freq: Every day | ORAL | Status: DC
  Administered 2018-06-22: 20 mg via ORAL
  Filled 2018-06-22: qty 1

## 2018-06-22 MED ORDER — LISINOPRIL-HYDROCHLOROTHIAZIDE 20-25 MG PO TABS: 1.0000 | ORAL_TABLET | Freq: Every day | ORAL | Status: DC

## 2018-06-22 MED ORDER — GADOBENATE DIMEGLUMINE 529 MG/ML IV SOLN
20.0000 mL | Freq: Once | INTRAVENOUS | Status: AC | PRN
  Administered 2018-06-22: 20 mL via INTRAVENOUS

## 2018-06-22 MED ORDER — HYDROCHLOROTHIAZIDE 25 MG PO TABS
25.0000 mg | ORAL_TABLET | Freq: Every day | ORAL | Status: DC
  Administered 2018-06-22: 25 mg via ORAL
  Filled 2018-06-22: qty 1

## 2018-06-22 MED ORDER — SODIUM CHLORIDE 0.9 % IV BOLUS
500.0000 mL | Freq: Once | INTRAVENOUS | Status: AC
  Administered 2018-06-22: 500 mL via INTRAVENOUS

## 2018-06-22 NOTE — ED Notes (Signed)
Vascular at bedside

## 2018-06-22 NOTE — ED Notes (Signed)
MRI has informed this nurse that this patient's test will likely not be done for a few hours.

## 2018-06-22 NOTE — Progress Notes (Signed)
Preliminary notes--Right lower extremity venous duplex exam completed. Negative for DVT.  Patrick [name] (RDMS RVT) 06/22/18 12:50 PM

## 2018-06-22 NOTE — Discharge Instructions (Addendum)
There was no clear finding to explain your discomfort.  It is likely a nerve irritation, that can be treated symptomatically.  Use ibuprofen 4 to 600 mg 3 times a day with meals for a week or so.  Also try using heat on the area of discomfort 3 or 4 times a day.  Your blood pressure is high and it is important to take your medicines as prescribed.  Also follow-up with your primary care doctor in a week or so, to get a blood pressure check.  Your glucose is very high.  Make sure you are staying on a low carbohydrate diet, and taking your medications for diabetes as prescribed.

## 2018-06-22 NOTE — ED Provider Notes (Addendum)
[facility]-EMERGENCY DEPT Provider Note   CSN: [id number] Arrival date & time: 06/22/18  4403     History   Chief Complaint Chief Complaint  Patient presents with  . Leg Swelling    bilateral  . Leg Pain    HPI Patrick [name] is a 47 y.o. male.  The history is provided by the patient. No language interpreter was used.  Leg Pain      Patrick [name] is a 47 y.o. male who presents to the Emergency Department complaining of leg swelling. He presents to the emergency department for evaluation of several months two years plus of bilateral lower extremity edema. He reports chronic lower extremity edema that is waxing and waning in nature. Over the last week he is noted that the swelling is worse. He states that the right leg is more swollen compared to the left. He also has chronic burning pain to the right thigh. It is on the lateral aspect of the thigh located between the hip and the knee. Pain is constant nature but worse with walking and moving. He denies any back pain, abdominal pain, chest pain, shortness of breath, nausea, vomiting, dysuria, difficulty with urination. No known injuries. He does work as a Administrator. He states that he drinks plenty of water and stays well hydrated at work. He does have a history of hypertension and diabetes and is compliant with his medication. He denies any tobacco or alcohol use. He has had prior evaluation in the past with vascular ultrasound of the leg in August 2018.  No OTC medications or recent dietary changes.    Past Medical History:  Diagnosis Date  . Diabetes mellitus without complication (HCC)   . Hyperlipidemia   . Hypertension   . Obesity     Patient Active Problem List   Diagnosis Date Noted  . Onychomycosis 02/05/2018  . Chronic right flank pain 02/05/2018  . Intermittent chest pain 10/31/2014  . OBESITY 07/30/2010  . Disorder of teeth and supporting structures 05/13/2009  . Hyperlipidemia 02/21/2008  .  HYPERTENSION, BENIGN ESSENTIAL 02/21/2008  . Diabetes mellitus without complication (HCC) 09/04/2007  . PROTEINURIA 09/04/2007    Past Surgical History:  Procedure Laterality Date  . CHOLECYSTECTOMY  2000        Home Medications    Prior to Admission medications   Medication Sig Start Date End Date Taking? Authorizing Provider  amLODipine (NORVASC) 5 MG tablet Take 1 tablet (5 mg total) by mouth daily. 07/10/17  Yes Mayo, Allyn Kenner, MD  aspirin EC 81 MG tablet Take 1 tablet (81 mg total) by mouth daily. 10/31/14  Yes Leona Singleton, MD  glipiZIDE (GLUCOTROL) 10 MG tablet TAKE 1 TABLET BY MOUTH TWICE DAILY BEFORE  A  MEAL 06/11/18  Yes Ellwood Dense, DO  glucose blood (WAVESENSE PRESTO) test strip Use as instructed - Dispense QS for up to twice daily testing. 01/02/14  Yes Hensel, Santiago Bumpers, MD  insulin degludec (TRESIBA FLEXTOUCH) 100 UNIT/ML SOPN FlexTouch Pen Inject 0.1 mLs (10 Units total) into the skin daily at 10 pm. 02/02/18  Yes Talbert Forest, [place], DO  lisinopril-hydrochlorothiazide (PRINZIDE,ZESTORETIC) 20-25 MG tablet Take 1 tablet by mouth daily. 03/05/18  Yes Mayo, Allyn Kenner, MD  metFORMIN (GLUCOPHAGE) 1000 MG tablet TAKE 1 TABLET BY MOUTH TWICE DAILY WITH  A  MEAL. 05/21/18  Yes Mayo, Allyn Kenner, MD  insulin degludec (TRESIBA FLEXTOUCH) 100 UNIT/ML SOPN FlexTouch Pen Inject 0.1 mLs (10 Units total) into the skin daily at 10  pm. Patient not taking: Reported on 06/22/2018 02/02/18   Moses MannersHensel, William A, MD  methocarbamol (ROBAXIN) 750 MG tablet Take 1-2 tablets (750-1,500 mg total) by mouth 3 (three) times daily as needed for muscle spasms. Patient not taking: Reported on 06/22/2018 02/15/18   Cristina GongHammond, Dutchess Crosland W, PA-C  rosuvastatin (CRESTOR) 20 MG tablet Take 1 tablet (20 mg total) by mouth daily. Patient not taking: Reported on 02/15/2018 01/15/15   Moses MannersHensel, William A, MD  terbinafine (LAMISIL) 250 MG tablet Take 1 tablet (250 mg total) by mouth daily. Patient not taking: Reported on  02/15/2018 02/02/18   Shirley, [place], DO    Family History Family History  Problem Relation Age of Onset  . Cancer Mother   . Diabetes Mother   . Hypertension Mother   . Diabetes Sister   . Cancer Maternal Grandmother   . Cancer Son   . Cancer Maternal Aunt   . Heart attack Neg Hx   . Stroke Neg Hx     Social History Social History   Tobacco Use  . Smoking status: Never Smoker  . Smokeless tobacco: Never Used  Substance Use Topics  . Alcohol use: No    Alcohol/week: 0.6 oz    Types: 1 drink(s) per week    Comment: 2014 quit, was drinking 2  beers daily at that time  . Drug use: No     Allergies   Patient has no known allergies.   Review of Systems Review of Systems  All other systems reviewed and are negative.    Physical Exam Updated Vital Signs BP (!) 167/96   Pulse 87   Temp 97.9 F (36.6 C) (Oral)   Resp 18   SpO2 97%   Physical Exam  Constitutional: He is oriented to person, place, and time. He appears well-developed and well-nourished.  HENT:  Head: Normocephalic and atraumatic.  Cardiovascular: Normal rate and regular rhythm.  No murmur heard. Pulmonary/Chest: Effort normal and breath sounds normal. No respiratory distress.  Abdominal: Soft. There is no tenderness. There is no rebound and no guarding.  Musculoskeletal:  One plus pitting edema to the right lower extremity from the foot to the knee. Trace pitting edema to the left lower extremity from the foot to the knee. No tenderness to palpation throughout the thigh. No overlying rashes or lesions.  Neurological: He is alert and oriented to person, place, and time.  Five out of five strength to bilateral lower extremities with sensation to light touch intact and bilateral lower extremities. Normal gait.  Skin: Skin is warm and dry. Capillary refill takes less than 2 seconds. No rash noted.  Psychiatric: He has a normal mood and affect. His behavior is normal.  Nursing note and vitals  reviewed.    ED Treatments / Results  Labs (all labs ordered are listed, but only abnormal results are displayed) Labs Reviewed  BASIC METABOLIC PANEL - Abnormal; Notable for the following components:      Result Value   Glucose, Bld 262 (*)    BUN 22 (*)    Creatinine, Ser 1.40 (*)    GFR calc non Af Amer 58 (*)    All other components within normal limits  CBC WITH DIFFERENTIAL/PLATELET    EKG None  Radiology Dg Lumbar Spine Complete  Result Date: 06/22/2018 CLINICAL DATA:  Chronic right thigh pain without known injury. EXAM: LUMBAR SPINE - COMPLETE 4+ VIEW COMPARISON:  None. FINDINGS: There is no evidence of lumbar spine fracture. Alignment is normal. Intervertebral  disc spaces are maintained. IMPRESSION: No significant abnormality seen in the lumbar spine. Electronically Signed   By: Lupita Raider, M.D.   On: 06/22/2018 09:59   Dg Femur Min 2 Views Right  Result Date: 06/22/2018 CLINICAL DATA:  Right thigh pain and swelling for 1 year. No injury. EXAM: RIGHT FEMUR 2 VIEWS COMPARISON:  None. FINDINGS: Degenerative changes in the right hip with osteophytes but no loss of joint space. No dislocation or fracture. No other fractures are seen. There is apparent periostitis along the medial proximal femoral diaphysis. There is increased sclerosis in the posterior femoral diaphysis medially and laterally. The patella also appears somewhat sclerotic with possible enthesopathic change. Question mild sclerosis of proximal tibia. No other abnormalities are identified. IMPRESSION: 1. Multiple findings in the right femur and possibly the patella and proximal tibia. There appears to be periosteal reaction along the medial proximal right femur. There is sclerosis in the medial and lateral posterior aspects of the femoral diaphysis. There appears to be sclerosis associated with the patella and possibly the proximal tibia. Recommend an MRI for further evaluation. Electronically Signed   By: Gerome Sam III M.D   On: 06/22/2018 10:33    Procedures Procedures (including critical care time)  Medications Ordered in ED Medications  lisinopril (PRINIVIL,ZESTRIL) tablet 20 mg (20 mg Oral Given 06/22/18 1218)    And  hydrochlorothiazide (HYDRODIURIL) tablet 25 mg (25 mg Oral Given 06/22/18 1218)  sodium chloride 0.9 % bolus 500 mL (0 mLs Intravenous Stopped 06/22/18 1425)     Initial Impression / Assessment and Plan / ED Course  I have reviewed the triage vital signs and the nursing notes.  Pertinent labs & imaging results that were available during my care of the patient were reviewed by me and considered in my medical decision making (see chart for details).  Clinical Course as of Jun 23 749  Fri Jun 22, 2018  1843 Normal except glucose high, BUN high, creatinine high, GFR low  Basic metabolic panel(!) [EW]  1844 Normal  CBC with Differential [EW]  1844 No sign for cancer, benign bony sclerosis present per radiologist report  MR FEMUR RIGHT W WO CONTRAST [EW]    Clinical Course User Index [EW] Mancel Bale, MD    Patient with history of hypertension and diabetes here for evaluation of bilateral lower extremity edema as well as right thigh pain. He is neurovascular intact on examination. No clinical findings concerning for acute CHF. Presentation is not consistent with PE. Right lower extremity Doppler is negative for DVT. Plan films of the femur obtained given reports of ongoing pain. There are some sclerotic abnormalities on plain films. Plan to obtain MRI to further evaluate. Patient updated findings of studies and recommendation for further imaging and he is in agreement with plan. Patient care transferred pending MRI.  Final Clinical Impressions(s) / ED Diagnoses   Final diagnoses:  Leg edema  Right thigh pain    ED Discharge Orders    None       Tilden Fossa, MD 06/22/18 1614    Tilden Fossa, MD 06/23/18 [phone]

## 2018-06-22 NOTE — ED Notes (Signed)
Patient transported to MRI 

## 2018-06-22 NOTE — ED Triage Notes (Signed)
Pt complains of constant burning bilateral leg pain and swelling, worse in right leg, for the past few months. Pt has been seen for same previously but states symptoms have not improved.

## 2018-06-22 NOTE — ED Provider Notes (Signed)
The patient was evaluated earlier by Dr. Madilyn Hookees, who asked me to check his MRI results prior to discharge.  He was noted to have hypertension, and had not taken his morning medication, it was administered here.  Clinical Course as of Jun 22 1856  Fri Jun 22, 2018  1843 Normal except glucose high, BUN high, creatinine high, GFR low  Basic metabolic panel(!) [EW]  1844 Normal  CBC with Differential [EW]  1844 No sign for cancer, benign bony sclerosis present per radiologist report  MR FEMUR RIGHT W WO CONTRAST [EW]    Clinical Course User Index [EW] Mancel BaleWentz, Harneet Noblett, MD     Patient Vitals for the past 24 hrs:  BP Temp Temp src Pulse Resp SpO2  06/22/18 1841 (!) 176/111 - - 84 18 99 %  06/22/18 1603 (!) 167/96 - - 87 18 97 %  06/22/18 1252 (!) 169/95 - - 82 15 100 %  06/22/18 1217 (!) 168/105 - - - - -  06/22/18 1039 (!) 172/99 - - 82 16 98 %  06/22/18 0818 (!) 181/108 97.9 F (36.6 C) Oral 96 15 98 %    6:46 PM Reevaluation with update and discussion. After initial assessment and treatment, an updated evaluation reveals at this time the patient remains hypertensive despite taking medications, around noon today.  Mancel Bale. Deonta Bomberger   Medical Decision Making: Nonspecific leg pain.  Hypertension present.  Prior episodes of hypertension on prior evaluations.  Patient describes his discomfort in the right lateral thigh as a "burning sensation."  No clinical evidence for hypertensive urgency.  Patient reports ongoing hyperglycemia despite taking medications and was started on insulin 1 week ago.  Is currently taking insulin once a day in addition to his oral hypoglycemics.  Doubt metabolic instability or impending vascular collapse.  Doubt lumbar myelopathy or radiculopathy.   CRITICAL CARE-no Performed by: Mancel BaleElliott Danniel Tones   Nursing Notes Reviewed/ Care Coordinated Applicable Imaging Reviewed Interpretation of Laboratory Data incorporated into ED treatment  The patient appears reasonably  screened and/or stabilized for discharge and I doubt any other medical condition or other [facility] requiring further screening, evaluation, or treatment in the ED at this time prior to discharge.  Plan: Home Medications-ibuprofen for pain, continue usual medications; Home Treatments-low-carb diet, heat to affected area; return here if the recommended treatment, does not improve the symptoms; Recommended follow up-PCP follow-up 1 week and as needed      Mancel BaleWentz, Justun Anaya, MD 06/22/18 1858

## 2018-06-22 NOTE — ED Notes (Signed)
Blue tube sent down to lab to hold, urine at bedside if needed.

## 2018-07-03 ENCOUNTER — Ambulatory Visit: Payer: Self-pay | Admitting: Family Medicine

## 2018-07-08 NOTE — Progress Notes (Signed)
Subjective:   Patient ID: Patrick [name]    DOB: 01-May-1971, 47 y.o. male   MRN: [id number]  Patrick [name] is a 47 y.o. male with a history of HTN, DM, HLD, obesity here for   Diabetes, Type 2 - Last A1c 10.4 01/2018 - Medications: glipizide 10mg , Tresiba 10u nightly, metformin 1000mg  BID - Compliance: hasn't been taking tresiba due to expense. Only got one week's worth. - Checking BG at home: no - Diet: hasn't eaten a lot of salt, cut down pork.  Eating fruits. Not much vegetables. - 24 hr recall: up at 7am. Breakfast at 9am (chicken biscuit from [employer], diet coke), chips at 3pm and drank sometime, dinner at 6pm (chicken wings in air fryer, corn no butter, diet coke), 10pm ate green grapes. Went to bed at 11:30pm. [name] was typical day. - Exercise: none - Eye exam: referral placed - Foot exam: 01/2018 - Denies symptoms of hypoglycemia, polyuria, polydipsia, numbness extremities, foot ulcers/trauma  Hypertension: - Medications: norvasc 5mg , lisinopril-HCTZ 20-25mg  daily - Compliance: good - Checking BP at home: no - Denies any SOB, vision changes, LE edema, medication SEs, or symptoms of hypotension - Diet: see above - Exercise: none - took meds today. - chest pain occasionally. Shooting pains every now and then, not during exertion. Lasts a couple seconds. No other associated sx. No radiation. - stress test 2016 ok.  Hyperlipidemia - Medications: Rosuvastatin 20mg  - Compliance: not taken in some time.  Review of Systems:  Per HPI.  PMFSH, medications and smoking status reviewed.  Objective:   BP 140/78   Pulse 88   Temp 98.8 F (37.1 C) (Oral)   Ht 5\' 8"  (1.727 m)   Wt 253 lb (114.8 kg)   SpO2 96%   BMI 38.47 kg/m  Vitals and nursing note reviewed.  General: obese male in no acute distress with non-toxic appearance CV: regular rate and rhythm without murmurs, rubs, or gallops, 2+ pitting lower extremity edema R>L, negative Homan's sign, no erythema or  pain. Lungs: clear to auscultation bilaterally with normal work of breathing Skin: warm, dry, no rashes or lesions Extremities: warm and well perfused, normal tone MSK: ROM grossly intact, strength intact, gait normal Neuro: Alert and oriented, speech normal  Assessment & Plan:   HYPERTENSION, BENIGN ESSENTIAL Slightly above goal today at 140/78. With lower extremity swelling, will increase HCTZ dose. No changes made to Norvasc or lisinopril dose. F/u in one month. Return precautions given.  Diabetes mellitus without complication A1c 9.4 today. Had good compliance with [name] however is too expensive. [facility] with [facility]. Continue glipizide and metformin. Discussed lifestyle interventions today including diet, exercise, and weight loss. Handout provided. Can consider nutrition referral at follow up. Follow up in one month.  Hyperlipidemia Hasn't taken statin in some time. Will obtain lipid panel, refill provided of statin.  OBESITY Discussed lifestyle interventions through diet and exercise. Handout provided. Can consider nutrition referral at next visit.  Orders Placed This Encounter  Procedures  . Basic Metabolic Panel  . Lipid Panel  . HgB A1c   Meds ordered this encounter  Medications  . glipiZIDE (GLUCOTROL) 10 MG tablet    Sig: TAKE 1 TABLET BY MOUTH TWICE DAILY BEFORE  A  MEAL    Dispense:  180 tablet    Refill:  1  . insulin degludec (TRESIBA FLEXTOUCH) 100 UNIT/ML SOPN FlexTouch Pen    Sig: Inject 0.1 mLs (10 Units total) into the  skin daily at 10 pm.    Dispense:  1 pen    Refill:  12  . DISCONTD: lisinopril-hydrochlorothiazide (PRINZIDE,ZESTORETIC) 20-25 MG tablet    Sig: Take 2 tablets by mouth daily.    Dispense:  90 tablet    Refill:  0  . insulin degludec (TRESIBA) 100 UNIT/ML SOPN FlexTouch Pen    Sig: Inject 0.1 mLs (10 Units total) into the skin daily.    Dispense:  1 pen    Refill:  0    Order  Specific Question:   Lot Number?    Answer:   [id number]    Order Specific Question:   Expiration Date?    Answer:   05/11/2020    Order Specific Question:   NDC    Answer:   1478-2956-21:   [id number] [[id number]]    Order Specific Question:   Quantity    Answer:   1  . hydrochlorothiazide (MICROZIDE) 12.5 MG capsule    Sig: Take 1 capsule (12.5 mg total) by mouth daily.    Dispense:  90 capsule    Refill:  0  . lisinopril-hydrochlorothiazide (PRINZIDE,ZESTORETIC) 20-25 MG tablet    Sig: Take 1 tablet by mouth daily.    Dispense:  90 tablet    Refill:  0  . Insulin Pen Needle 32G X 4 MM MISC    Sig: Use with [place] pen. Change needles with each use.    Dispense:  90 each    Refill:  2    Ellwood DenseAlison Rumball, DO PGY-2, [facility] 07/09/2018 12:19 PM

## 2018-07-09 ENCOUNTER — Ambulatory Visit (INDEPENDENT_AMBULATORY_CARE_PROVIDER_SITE_OTHER): Payer: Self-pay | Admitting: Family Medicine

## 2018-07-09 ENCOUNTER — Other Ambulatory Visit: Payer: Self-pay

## 2018-07-09 ENCOUNTER — Encounter: Payer: Self-pay | Admitting: Family Medicine

## 2018-07-09 VITALS — BP 140/78 | HR 88 | Temp 98.8°F | Ht 68.0 in | Wt 253.0 lb

## 2018-07-09 DIAGNOSIS — E7800 Pure hypercholesterolemia, unspecified: Secondary | ICD-10-CM

## 2018-07-09 DIAGNOSIS — Z6838 Body mass index (BMI) 38.0-38.9, adult: Secondary | ICD-10-CM

## 2018-07-09 DIAGNOSIS — I10 Essential (primary) hypertension: Secondary | ICD-10-CM

## 2018-07-09 DIAGNOSIS — E119 Type 2 diabetes mellitus without complications: Secondary | ICD-10-CM

## 2018-07-09 LAB — POCT GLYCOSYLATED HEMOGLOBIN (HGB A1C): HbA1c, POC (controlled diabetic range): 9.4 % — AB (ref 0.0–7.0)

## 2018-07-09 MED ORDER — HYDROCHLOROTHIAZIDE 12.5 MG PO CAPS: 12.5000 mg | ORAL_CAPSULE | Freq: Every day | ORAL | 0 refills | Status: DC

## 2018-07-09 MED ORDER — INSULIN PEN NEEDLE 32G X 4 MM MISC: 2 refills | Status: DC

## 2018-07-09 MED ORDER — LISINOPRIL-HYDROCHLOROTHIAZIDE 20-25 MG PO TABS: 1.0000 | ORAL_TABLET | Freq: Every day | ORAL | 0 refills | Status: DC

## 2018-07-09 MED ORDER — GLIPIZIDE 10 MG PO TABS: ORAL_TABLET | ORAL | 1 refills | Status: DC

## 2018-07-09 MED ORDER — LISINOPRIL-HYDROCHLOROTHIAZIDE 20-25 MG PO TABS: 2.0000 | ORAL_TABLET | Freq: Every day | ORAL | 0 refills | Status: DC

## 2018-07-09 MED ORDER — INSULIN DEGLUDEC 100 UNIT/ML ~~LOC~~ SOPN: 10.0000 [IU] | PEN_INJECTOR | Freq: Every day | SUBCUTANEOUS | 0 refills | Status: DC

## 2018-07-09 MED ORDER — INSULIN DEGLUDEC 100 UNIT/ML ~~LOC~~ SOPN: 10.0000 [IU] | PEN_INJECTOR | Freq: Every day | SUBCUTANEOUS | 12 refills | Status: DC

## 2018-07-09 NOTE — Patient Instructions (Addendum)
It was great to see you!  Our plans for today:  - We are refilling your Tresiba and [facility]. This should be cheaper for you. - We increased your blood pressure medication. If you develop low blood pressures, dizziness, or lightheadedness, call us immediately or go to the ED.  We are checking some labs today, we will call you or send you a letter if they are abnormal.   Take care and seek immediate care sooner if you develop any concerns.   Dr. Rumball [facility]  Here is an example of what a healthy plate looks like:    ? Make half your plate fruits and vegetables.     ? Focus on whole fruits.     ? Vary your veggies.  ? Make half your grains whole grains. -     ? Look for the word "whole" at the beginning of the ingredients list    ? Some whole-grain ingredients include whole oats, whole-wheat flour,        whole-grain corn, whole-grain brown rice, and whole rye.  ? Move to low-fat and fat-free milk or yogurt.  ? Vary your protein routine. - Meat, fish, poultry (chicken, turkey), eggs, beans (kidney, pinto), dairy.  ? Drink and eat less sodium, saturated fat, and added sugars.  Look for opportunities to move your body throughout your day:  Never lie down when you can sit; never sit when you can stand; never stand when you can pace.  Moving your body throughout the day is just as important as the 30 or 60 minutes of exercise at the gym!  Get social Get active with your friends instead of going out to eat. Go for a hike, walk around the mall, or play an exercise-themed video game.   Move more at work Fit more activity into the workday. Stand during phone calls, use a printer farther from your desk, and get up to stretch each hour.    Do something new Develop a new skill to kick-start your motivation. Sign up for a class to learn how to salsa dance, surf, do tai chi, or play a sport.    Keep cool in the pool Don't like to sweat? Hit  the local community pool for a swim, water polo, or water aerobics class to stay cool while exercising.    Stay on track Use a fitness tracker (FITBIT, Fitness Pal mobile app) to track your activity and provide motivation to reach your goals.    

## 2018-07-09 NOTE — Assessment & Plan Note (Signed)
A1c 9.4 today. Had good compliance with Evaristo Buryresiba however is too expensive. [facility] be [facility] with [facility]. Continue glipizide and metformin. Discussed lifestyle interventions today including diet, exercise, and weight loss. Handout provided. Can consider nutrition referral at follow up. Follow up in one month.

## 2018-07-09 NOTE — Assessment & Plan Note (Signed)
Slightly above goal today at 140/78. With lower extremity swelling, will increase HCTZ dose. No changes made to Norvasc or lisinopril dose. F/u in one month. Return precautions given.

## 2018-07-09 NOTE — Assessment & Plan Note (Signed)
Discussed lifestyle interventions through diet and exercise. Handout provided. Can consider nutrition referral at next visit.

## 2018-07-09 NOTE — Assessment & Plan Note (Signed)
Hasn't taken statin in some time. Will obtain lipid panel, refill provided of statin.

## 2018-07-10 LAB — BASIC METABOLIC PANEL
BUN/Creatinine Ratio: 13 (ref 9–20)
BUN: 16 mg/dL (ref 6–24)
CALCIUM: 9.7 mg/dL (ref 8.7–10.2)
CHLORIDE: 102 mmol/L (ref 96–106)
CO2: 23 mmol/L (ref 20–29)
Creatinine, Ser: 1.21 mg/dL (ref 0.76–1.27)
GFR calc non Af Amer: 70 mL/min/{1.73_m2} (ref >59)
GFR, EST AFRICAN AMERICAN: 81 mL/min/{1.73_m2} (ref >59)
GLUCOSE: 223 mg/dL — AB (ref 65–99)
Potassium: 4 mmol/L (ref 3.5–5.2)
Sodium: 141 mmol/L (ref 134–144)

## 2018-07-12 ENCOUNTER — Telehealth: Payer: Self-pay | Admitting: Family Medicine

## 2018-07-12 ENCOUNTER — Other Ambulatory Visit: Payer: Self-pay | Admitting: Family Medicine

## 2018-07-12 DIAGNOSIS — E785 Hyperlipidemia, unspecified: Secondary | ICD-10-CM

## 2018-07-12 MED ORDER — ROSUVASTATIN CALCIUM 20 MG PO TABS: 20.0000 mg | ORAL_TABLET | Freq: Every day | ORAL | 3 refills | Status: DC

## 2018-07-12 NOTE — Telephone Encounter (Signed)
Called patient to discuss lipid panel results. Per last visit, patient prescribed statin but not currently taking.  Based on lipid panel results and current risk factors (ASCVD 19.3%), instructed him to take rosuvastatin, [facility]

## 2018-07-13 LAB — LIPID PANEL
CHOL/HDL RATIO: 6.2 ratio — AB (ref 0.0–5.0)
CHOLESTEROL TOTAL: 167 mg/dL (ref 100–199)
HDL: 27 mg/dL — ABNORMAL LOW (ref >39)
LDL Calculated: 84 mg/dL (ref 0–99)
TRIGLYCERIDES: 282 mg/dL — AB (ref 0–149)
VLDL Cholesterol Cal: 56 mg/dL — ABNORMAL HIGH (ref 5–40)

## 2018-07-27 ENCOUNTER — Other Ambulatory Visit: Payer: Self-pay | Admitting: *Deleted

## 2018-07-27 DIAGNOSIS — I10 Essential (primary) hypertension: Secondary | ICD-10-CM

## 2018-07-27 MED ORDER — AMLODIPINE BESYLATE 5 MG PO TABS: 5.0000 mg | ORAL_TABLET | Freq: Every day | ORAL | 0 refills | Status: DC

## 2018-07-27 NOTE — Telephone Encounter (Signed)
Refill given, have patient make appointment for follow up for HTN

## 2018-07-30 NOTE — Telephone Encounter (Signed)
Patient has an appointment on 08-31-18. Patrick [name],CMA

## 2018-08-20 ENCOUNTER — Other Ambulatory Visit: Payer: Self-pay

## 2018-08-20 MED ORDER — METFORMIN HCL 1000 MG PO TABS: 1000.0000 mg | ORAL_TABLET | Freq: Two times a day (BID) | ORAL | 1 refills | Status: DC

## 2018-08-29 NOTE — Progress Notes (Deleted)
  Subjective:   Patient ID: Patrick [name]    DOB: 10/18/1971, 47 y.o. male   MRN: [id number]  Patrick [name] is a 47 y.o. male with a history of DM2, HTN, HLD, obesity here for ***  Diabetes, Type 2 - Last A1c 9.4 07/09/18 - Medications: Tresiba 10u nightly, glipizide 10mg , metformin 1000mg  BID - Compliance: *** - Checking BG at home: *** - Diet: *** - Exercise: *** - Eye exam: *** - Foot exam: *** - Microalbumin: *** - Denies symptoms of hypoglycemia, polyuria, polydipsia, numbness extremities, foot ulcers/trauma ***consider nutritional vs diabetic education referral. Need to get CBG monitor, set up with koval.  Hypertension: - Medications: norvasc 5mg , lisinopril-HCTZ 20-25mg , HCTZ 12.5mg  daily - Compliance: *** - Checking BP at home: *** - Denies any SOB, CP, vision changes, LE edema, medication SEs, or symptoms of hypotension - Diet: *** - Exercise: ***  *** - ***  Review of Systems:  Per HPI.  PMFSH, medications and smoking status reviewed.  Objective:   There were no vitals taken for this visit. Vitals and nursing note reviewed.  General: well nourished, well developed, in no acute distress with non-toxic appearance HEENT: normocephalic, atraumatic, moist mucous membranes Neck: supple, non-tender without lymphadenopathy CV: regular rate and rhythm without murmurs, rubs, or gallops, no lower extremity edema Lungs: clear to auscultation bilaterally with normal work of breathing Abdomen: soft, non-tender, non-distended, no masses or organomegaly palpable, normoactive bowel sounds Skin: warm, dry, no rashes or lesions Extremities: warm and well perfused, normal tone MSK: ROM grossly intact, strength intact, gait normal Neuro: Alert and oriented, speech normal  Assessment & Plan:   No problem-specific Assessment & Plan notes found for this encounter.  No orders of the defined types were placed in this encounter.  No orders of the defined types were placed in this  encounter.   Ellwood DenseAlison Alexey Rhoads, DO PGY-2,  Family [facility] 08/29/2018 5:42 PM

## 2018-08-31 ENCOUNTER — Ambulatory Visit: Payer: Self-pay | Admitting: Family Medicine

## 2018-10-14 NOTE — Progress Notes (Deleted)
  Subjective:   Patient ID: Patrick [name]    DOB: 11-05-1971, 47 y.o. male   MRN: [id number]  Patrick [name] is a 47 y.o. male with a history of HTN, DM, obesity here for   Diabetes, Type 2 - Last A1c 9.4 07/09/18 - Medications: tresiba 10u, metformin 2000mg  daily, glipizide 10mg  - Compliance: *** - Checking BG at home: *** - Diet: *** - Exercise: *** - Eye exam: referral placed - Foot exam: UTD - Microalbumin: N/A - Denies symptoms of hypoglycemia, polyuria, polydipsia, numbness extremities, foot ulcers/trauma ***nutrition?  Hypertension: - Medications: norvasc 5 mg, HCTZ 12.5mg , lisinopril-HCTZ 20-25mg  - Compliance: *** - Checking BP at home: *** - Denies any SOB, CP, vision changes, LE edema, medication SEs, or symptoms of hypotension - Diet: *** - Exercise: ***  Review of Systems:  Per HPI.  PMFSH, medications and smoking status reviewed.  Objective:   There were no vitals taken for this visit. Vitals and nursing note reviewed.  General: well nourished, well developed, in no acute distress with non-toxic appearance HEENT: normocephalic, atraumatic, moist mucous membranes Neck: supple, non-tender without lymphadenopathy CV: regular rate and rhythm without murmurs, rubs, or gallops, no lower extremity edema Lungs: clear to auscultation bilaterally with normal work of breathing Abdomen: soft, non-tender, non-distended, no masses or organomegaly palpable, normoactive bowel sounds Skin: warm, dry, no rashes or lesions Extremities: warm and well perfused, normal tone MSK: ROM grossly intact, strength intact, gait normal Neuro: Alert and oriented, speech normal  Assessment & Plan:   No problem-specific Assessment & Plan notes found for this encounter.  No orders of the defined types were placed in this encounter.  No orders of the defined types were placed in this encounter.   Ellwood Dense, DO PGY-2, [facility] 10/14/2018 10:08 PM

## 2018-10-15 ENCOUNTER — Ambulatory Visit: Payer: Self-pay | Admitting: Family Medicine

## 2018-11-01 NOTE — Progress Notes (Signed)
Subjective:   Patient ID: Patrick [name]    DOB: 04-30-1971, 47 y.o. male   MRN: [id number]  Patrick [name] is a 47 y.o. male with a history of HTN, DM, HLD, obesity here for   Diabetes, Type 2 - Last A1c 9.4, 07/09/2018 - Medications: glipizide 10mg , [name] 10u daily, metformin 1000mg  BID - Compliance: hasn't been able to get [place] at [facility], previously was able to get at [facility] program. - Checking BG at home: no - Diet: has been cutting back sweets, eats  - 24hr recall: woke up at 6am, breakfast at 8am (chicken biscuit at [employer], Diet Mt Dew), 1pm ([employer] cold cut with lettuce, tomato, mayo, Diet Coke, probably chips), 7pm (barbecue chicken, macaroni and green beans, dinner rolls), went to bed at 9-10pm. Yesterday was typical day.  - Exercise: none - Eye exam: due, ordered - Foot exam: UTD - Microalbumin: N/A - Denies symptoms of hypoglycemia, numbness extremities, foot ulcers/trauma - some numbness and tingling in feet, polyuria, polydipsia.  Hypertension: - Medications: amlodipine 5mg , lisinopril 20-25mg , HCTZ 12.5mg  - Compliance: stopped taking HCTZ 12.5mg  due to cramping - Checking BP at home: every so often, doesn't know numbers - Denies any SOB, CP, vision changes, medication SEs - Diet: see above - Exercise: see above  Hyperlipidemia - ASCVD 19%, initiated rosuvastatin however was not able to get due to expense.  Review of Systems:  Per HPI.  PMFSH, medications and smoking status reviewed.  Objective:   BP 140/90   Pulse 83   Temp 98.1 F (36.7 C) (Oral)   Ht 5\' 8"  (1.727 m)   Wt 248 lb (112.5 kg)   SpO2 98%   BMI 37.71 kg/m  Vitals and nursing note reviewed.  General: obese male, in no acute distress with non-toxic appearance CV: regular rate and rhythm without murmurs, rubs, or gallops, trace LE edema Lungs: clear to auscultation bilaterally with normal work of breathing Skin: warm, dry, no rashes or lesions Extremities: warm and well perfused, normal tone MSK:  ROM grossly intact, strength intact, gait normal Neuro: Alert and oriented, speech normal  Assessment & Plan:   Diabetes mellitus without complication Worsened today, A1c 10.2.  Will provide patient with [name] sample until he can get re-establish with the map program, will provide information regarding recertification.  Sent lancets order into [facility] with instructions not to take insulin until he is able to check his blood sugars.  Follow-up in 1 month.  HYPERTENSION, BENIGN ESSENTIAL Borderline with withholding 12.5 HCTZ.  Believe it is reasonable to not adjust medications at this time.  Advised to check blood pressure at home and keep documentation.  Follow-up in 1 month.  Hyperlipidemia Difficulties obtaining rosuvastatin due to expense, will change to atorvastatin.  Orders Placed This Encounter  Procedures  . HgB A1c   Meds ordered this encounter  Medications  . DISCONTD: Lancets 30G MISC    Sig: 1 each by Does not apply route 3 (three) times daily.    Dispense:  100 each    Refill:  3  . insulin degludec (TRESIBA) 100 UNIT/ML SOPN FlexTouch Pen    Sig: Inject 0.1 mLs (10 Units total) into the skin daily.    Dispense:  1 pen    Refill:  0    Order Specific Question:   Lot Number?    Answer:   [id number]    Order Specific Question:   Expiration Date?    Answer:   08/11/2020    Order Specific Question:  NDC    Answer:   [id number] [[id number]]    Order Specific Question:   Quantity    Answer:   2  . insulin degludec (TRESIBA FLEXTOUCH) 100 UNIT/ML SOPN FlexTouch Pen    Sig: Inject 0.1 mLs (10 Units total) into the skin daily at 10 pm.    Dispense:  1 pen    Refill:  12  . Lancets 30G MISC    Sig: 1 each by Does not apply route 3 (three) times daily.    Dispense:  100 each    Refill:  3  . atorvastatin (LIPITOR) 40 MG tablet    Sig: Take 1 tablet (40 mg total) by mouth daily.    Dispense:  90 tablet    Refill:  3    Ellwood DenseAlison Kayson Tasker, DO PGY-2, [facility] 11/02/2018 3:29 PM

## 2018-11-02 ENCOUNTER — Ambulatory Visit (INDEPENDENT_AMBULATORY_CARE_PROVIDER_SITE_OTHER): Payer: Self-pay | Admitting: Family Medicine

## 2018-11-02 ENCOUNTER — Encounter: Payer: Self-pay | Admitting: Family Medicine

## 2018-11-02 ENCOUNTER — Other Ambulatory Visit: Payer: Self-pay

## 2018-11-02 VITALS — BP 140/90 | HR 83 | Temp 98.1°F | Ht 68.0 in | Wt 248.0 lb

## 2018-11-02 DIAGNOSIS — E119 Type 2 diabetes mellitus without complications: Secondary | ICD-10-CM

## 2018-11-02 DIAGNOSIS — E7800 Pure hypercholesterolemia, unspecified: Secondary | ICD-10-CM

## 2018-11-02 DIAGNOSIS — I10 Essential (primary) hypertension: Secondary | ICD-10-CM

## 2018-11-02 LAB — POCT GLYCOSYLATED HEMOGLOBIN (HGB A1C): HbA1c, POC (controlled diabetic range): 10.2 % — AB (ref 0.0–7.0)

## 2018-11-02 MED ORDER — INSULIN DEGLUDEC 100 UNIT/ML ~~LOC~~ SOPN: 10.0000 [IU] | PEN_INJECTOR | Freq: Every day | SUBCUTANEOUS | 12 refills | Status: DC

## 2018-11-02 MED ORDER — ATORVASTATIN CALCIUM 40 MG PO TABS: 40.0000 mg | ORAL_TABLET | Freq: Every day | ORAL | 3 refills | Status: DC

## 2018-11-02 MED ORDER — LANCETS 30G MISC: 1.0000 | Freq: Three times a day (TID) | 3 refills | Status: DC

## 2018-11-02 MED ORDER — INSULIN DEGLUDEC 100 UNIT/ML ~~LOC~~ SOPN: 10.0000 [IU] | PEN_INJECTOR | Freq: Every day | SUBCUTANEOUS | 0 refills | Status: DC

## 2018-11-02 NOTE — Assessment & Plan Note (Signed)
Difficulties obtaining rosuvastatin due to expense, will change to atorvastatin.

## 2018-11-02 NOTE — Assessment & Plan Note (Addendum)
Worsened today, A1c 10.2.  Will provide patient with Evaristo Buryresiba sample until he can get re-establish with the map program, will provide information regarding recertification.  Sent lancets order into [facility] with instructions not to take insulin until he is able to check his blood sugars.  Follow-up in 1 month.

## 2018-11-02 NOTE — Assessment & Plan Note (Signed)
Borderline with withholding 12.5 HCTZ.  Believe it is reasonable to not adjust medications at this time.  Advised to check blood pressure at home and keep documentation.  Follow-up in 1 month.

## 2018-11-02 NOTE — Patient Instructions (Signed)
It was great to see you!  Our plans for today:  - We will need to start you on insulin. I will call you early next week to determine which would be the best option for you given you don't have insurance. - For now, work on eating more vegetables and cutting out sweets (see below), and incorporating more activity in your day. Walking after each meal is great at lowering your blood sugars.  Take care and seek immediate care sooner if you develop any concerns.   Dr. Mollie Germanyumball [facility]   Diet Recommendations for Diabetes   1. Eat at least 3 meals and 1-2 snacks per day. Never go more than 4-5 hours while awake without eating. Eat breakfast within the first hour of getting up.   2. Limit starchy foods to TWO per meal and ONE per snack. ONE portion of a starchy  food is equal to the following:   - ONE slice of bread (or its equivalent, such as half of a hamburger bun).   - 1/2 cup of a "scoopable" starchy food such as potatoes or rice.   - 15 grams of Total Carbohydrate as shown on food label.  3. Include at every meal: a protein food, a carb food, and vegetables and/or fruit.   - Obtain twice the volume of vegetables as protein or carbohydrate foods for both lunch and dinner.   - Fresh or frozen vegetables are best.   - Keep frozen vegetables on hand for a quick vegetable serving.       Starchy (carb) foods: Bread, rice, pasta, potatoes, corn, cereal, grits, crackers, bagels, muffins, all baked goods.  (Fruits, milk, and yogurt also have carbohydrate, but most of these foods will not spike your blood sugar as most starchy foods will.)  A few fruits do cause high blood sugars; use small portions of bananas (limit to 1/2 at a time), grapes, watermelon, oranges, and most tropical fruits.    Protein foods: Meat, fish, poultry, eggs, dairy foods, and beans such as pinto and kidney beans (beans also provide carbohydrate).    Here is an example of what a healthy plate looks like:    ?  Make half your plate fruits and vegetables.     ? Focus on whole fruits.     ? Vary your veggies.  ? Make half your grains whole grains. -     ? Look for the word "whole" at the beginning of the ingredients list    ? Some whole-grain ingredients include whole oats, whole-wheat flour,        whole-grain corn, whole-grain brown rice, and whole rye.  ? Move to low-fat and fat-free milk or yogurt.  ? Vary your protein routine. - Meat, fish, poultry (chicken, [place]), eggs, beans (kidney, pinto), dairy.  ? Drink and eat less sodium, saturated fat, and added sugars.

## 2018-11-17 ENCOUNTER — Other Ambulatory Visit: Payer: Self-pay | Admitting: Family Medicine

## 2018-11-17 DIAGNOSIS — I10 Essential (primary) hypertension: Secondary | ICD-10-CM

## 2018-12-28 ENCOUNTER — Other Ambulatory Visit: Payer: Self-pay | Admitting: Family Medicine

## 2018-12-28 NOTE — Telephone Encounter (Signed)
Refill provided, Please help patient schedule follow up appointment for diabetes soon.

## 2018-12-28 NOTE — Telephone Encounter (Signed)
Unable to reach by phone.  Letter created and routed to admin. Fleeger, Maryjo RochesterJessica Dawn, CMA

## 2019-01-22 ENCOUNTER — Other Ambulatory Visit: Payer: Self-pay | Admitting: Internal Medicine

## 2019-01-22 ENCOUNTER — Other Ambulatory Visit: Payer: Self-pay | Admitting: Family Medicine

## 2019-01-22 DIAGNOSIS — I10 Essential (primary) hypertension: Secondary | ICD-10-CM

## 2019-01-22 NOTE — Telephone Encounter (Signed)
Refill provided. Please have patient make f/u appt to discuss diabetes.

## 2019-01-24 ENCOUNTER — Emergency Department (HOSPITAL_COMMUNITY)
Admission: EM | Admit: 2019-01-24 | Discharge: 2019-01-24 | Disposition: A | Discharge status: Home / Self Care | Payer: Self-pay | Attending: Emergency Medicine | Admitting: Emergency Medicine

## 2019-01-24 ENCOUNTER — Other Ambulatory Visit: Payer: Self-pay

## 2019-01-24 ENCOUNTER — Encounter (HOSPITAL_COMMUNITY): Payer: Self-pay | Admitting: Emergency Medicine

## 2019-01-24 ENCOUNTER — Emergency Department (HOSPITAL_COMMUNITY): Payer: Self-pay

## 2019-01-24 DIAGNOSIS — M62838 Other muscle spasm: Secondary | ICD-10-CM | POA: Insufficient documentation

## 2019-01-24 DIAGNOSIS — I10 Essential (primary) hypertension: Secondary | ICD-10-CM | POA: Insufficient documentation

## 2019-01-24 DIAGNOSIS — E119 Type 2 diabetes mellitus without complications: Secondary | ICD-10-CM | POA: Insufficient documentation

## 2019-01-24 DIAGNOSIS — Z7982 Long term (current) use of aspirin: Secondary | ICD-10-CM | POA: Insufficient documentation

## 2019-01-24 DIAGNOSIS — Z79899 Other long term (current) drug therapy: Secondary | ICD-10-CM | POA: Insufficient documentation

## 2019-01-24 DIAGNOSIS — Z794 Long term (current) use of insulin: Secondary | ICD-10-CM | POA: Insufficient documentation

## 2019-01-24 DIAGNOSIS — R072 Precordial pain: Secondary | ICD-10-CM | POA: Insufficient documentation

## 2019-01-24 LAB — CBC
HCT: 43.9 % (ref 39.0–52.0)
Hemoglobin: 14.9 g/dL (ref 13.0–17.0)
MCH: 30.8 pg (ref 26.0–34.0)
MCHC: 33.9 g/dL (ref 30.0–36.0)
MCV: 90.9 fL (ref 80.0–100.0)
Platelets: 243 10*3/uL (ref 150–400)
RBC: 4.83 MIL/uL (ref 4.22–5.81)
RDW: 12.9 % (ref 11.5–15.5)
WBC: 7.1 10*3/uL (ref 4.0–10.5)
nRBC: 0 % (ref 0.0–0.2)

## 2019-01-24 LAB — BASIC METABOLIC PANEL
ANION GAP: 9 (ref 5–15)
BUN: 23 mg/dL — ABNORMAL HIGH (ref 6–20)
CO2: 30 mmol/L (ref 22–32)
Calcium: 9.6 mg/dL (ref 8.9–10.3)
Chloride: 99 mmol/L (ref 98–111)
Creatinine, Ser: 1.44 mg/dL — ABNORMAL HIGH (ref 0.61–1.24)
GFR calc Af Amer: >60 mL/min (ref >60)
GFR calc non Af Amer: 57 mL/min — ABNORMAL LOW (ref >60)
Glucose, Bld: 264 mg/dL — ABNORMAL HIGH (ref 70–99)
POTASSIUM: 3.5 mmol/L (ref 3.5–5.1)
Sodium: 138 mmol/L (ref 135–145)

## 2019-01-24 LAB — HEPATIC FUNCTION PANEL
ALT: 25 U/L (ref 0–44)
AST: 22 U/L (ref 15–41)
Albumin: 4 g/dL (ref 3.5–5.0)
Alkaline Phosphatase: 48 U/L (ref 38–126)
Bilirubin, Direct: 0.1 mg/dL (ref 0.0–0.2)
Indirect Bilirubin: 0.6 mg/dL (ref 0.3–0.9)
Total Bilirubin: 0.7 mg/dL (ref 0.3–1.2)
Total Protein: 7.8 g/dL (ref 6.5–8.1)

## 2019-01-24 LAB — LIPASE, BLOOD: Lipase: 41 U/L (ref 11–51)

## 2019-01-24 LAB — POCT I-STAT TROPONIN I
Troponin i, poc: 0 ng/mL (ref 0.00–0.08)
Troponin i, poc: 0 ng/mL (ref 0.00–0.08)

## 2019-01-24 LAB — D-DIMER, QUANTITATIVE (NOT AT ARMC): D DIMER QUANT: 1.04 ug{FEU}/mL — AB (ref 0.00–0.50)

## 2019-01-24 MED ORDER — CYCLOBENZAPRINE HCL 10 MG PO TABS: 10.0000 mg | ORAL_TABLET | Freq: Two times a day (BID) | ORAL | 0 refills | Status: DC | PRN

## 2019-01-24 MED ORDER — SODIUM CHLORIDE (PF) 0.9 % IJ SOLN
INTRAMUSCULAR | Status: AC
  Filled 2019-01-24: qty 50

## 2019-01-24 MED ORDER — LIDOCAINE VISCOUS HCL 2 % MT SOLN
15.0000 mL | Freq: Once | OROMUCOSAL | Status: AC
  Administered 2019-01-24: 15 mL via ORAL
  Filled 2019-01-24: qty 15

## 2019-01-24 MED ORDER — ALUM & MAG HYDROXIDE-SIMETH 200-200-20 MG/5ML PO SUSP
30.0000 mL | Freq: Once | ORAL | Status: AC
  Administered 2019-01-24: 30 mL via ORAL
  Filled 2019-01-24: qty 30

## 2019-01-24 MED ORDER — IOPAMIDOL (ISOVUE-370) INJECTION 76%
INTRAVENOUS | Status: AC
  Filled 2019-01-24: qty 100

## 2019-01-24 MED ORDER — SODIUM CHLORIDE 0.9% FLUSH
3.0000 mL | Freq: Once | INTRAVENOUS | Status: AC
  Administered 2019-01-24: 3 mL via INTRAVENOUS

## 2019-01-24 MED ORDER — IOPAMIDOL (ISOVUE-370) INJECTION 76%
100.0000 mL | Freq: Once | INTRAVENOUS | Status: AC | PRN
  Administered 2019-01-24: 100 mL via INTRAVENOUS

## 2019-01-24 NOTE — Discharge Instructions (Signed)
Your work-up today was overall reassuring.  Your cardiac enzymes were negative twice and your EKG was reassuring.  There is no evidence of abnormality on your chest x-ray or CT scan, no pulmonary embolism.  As your symptoms have improved, we feel you are safe for discharge home.  We suspect is more related to the muscle pain and muscle spasm as were able to cause the pain and is worsened when you move your arm around.  Please follow-up with your primary doctor and stay hydrated.  Please use the muscle relaxant with your symptoms.  If any symptoms change or worsen, please return emergency department.

## 2019-01-24 NOTE — ED Triage Notes (Signed)
Patient here from home with complaints of chest pain x2 days. Left sided radiating down into left arm. Pain "comes in waves", currently a 5 right now.

## 2019-01-24 NOTE — ED Provider Notes (Signed)
[facility]-EMERGENCY DEPT Provider Note   CSN: [id number] Arrival date & time: 01/24/19  1028     History   Chief Complaint Chief Complaint  Patient presents with  . Chest Pain    HPI Sebastien Jackson is a 48 y.o. male.  The history is provided by the patient and medical records. No language interpreter was used.  Chest Pain  Pain location:  L chest Pain quality: aching and sharp   Pain radiates to:  L shoulder Pain severity:  Moderate Onset quality:  Gradual Duration:  3 days Timing:  Intermittent Progression:  Waxing and waning Chronicity:  New Context: breathing, movement and raising an arm   Relieved by:  Nothing Worsened by:  Movement, deep breathing, coughing and certain positions Ineffective treatments:  None tried Associated symptoms: heartburn   Associated symptoms: no abdominal pain, no anorexia, no back pain, no claudication, no cough, no diaphoresis, no dizziness, no dysphagia, no fatigue, no fever, no headache, no lower extremity edema, no nausea, no numbness, no palpitations, no shortness of breath, no syncope, no vomiting and no weakness   Risk factors: diabetes mellitus, hypertension and male sex   Risk factors: no prior DVT/PE     Past Medical History:  Diagnosis Date  . Diabetes mellitus without complication (HCC)   . Hyperlipidemia   . Hypertension   . Obesity     Patient Active Problem List   Diagnosis Date Noted  . Onychomycosis 02/05/2018  . Chronic right flank pain 02/05/2018  . Intermittent chest pain 10/31/2014  . OBESITY 07/30/2010  . Disorder of teeth and supporting structures 05/13/2009  . Hyperlipidemia 02/21/2008  . HYPERTENSION, BENIGN ESSENTIAL 02/21/2008  . Diabetes mellitus without complication (HCC) 09/04/2007  . PROTEINURIA 09/04/2007    Past Surgical History:  Procedure Laterality Date  . CHOLECYSTECTOMY  2000        Home Medications    Prior to Admission medications   Medication Sig Start  Date End Date Taking? Authorizing Provider  amLODipine (NORVASC) 5 MG tablet TAKE 1 TABLET BY MOUTH ONCE DAILY 11/19/18  Yes Ellwood Dense, DO  aspirin EC 81 MG tablet Take 1 tablet (81 mg total) by mouth daily. 10/31/14  Yes Leona Singleton, MD  glipiZIDE (GLUCOTROL) 10 MG tablet TAKE 1 TABLET BY MOUTH TWICE DAILY BEFORE A MEAL Patient taking differently: Take 10 mg by mouth 2 (two) times daily before a meal. TAKE 1 TABLET BY MOUTH TWICE DAILY BEFORE  A  MEAL 01/22/19  Yes Rumball, Alison, DO  insulin degludec (TRESIBA FLEXTOUCH) 100 UNIT/ML SOPN FlexTouch Pen Inject 0.1 mLs (10 Units total) into the skin daily at 10 pm. 11/02/18  Yes Ellwood Dense, DO  lisinopril-hydrochlorothiazide (PRINZIDE,ZESTORETIC) 20-25 MG tablet TAKE 1 TABLET BY MOUTH DAILY 01/22/19  Yes Ellwood Dense, DO  metFORMIN (GLUCOPHAGE) 1000 MG tablet Take 1 tablet (1,000 mg total) by mouth 2 (two) times daily with a meal. 08/20/18  Yes Sherin, Abraham, DO  rosuvastatin (CRESTOR) 20 MG tablet Take 1 tablet (20 mg total) by mouth daily. 07/12/18  Yes Ellwood Dense, DO  atorvastatin (LIPITOR) 40 MG tablet Take 1 tablet (40 mg total) by mouth daily. Patient not taking: Reported on 01/24/2019 11/02/18   Ellwood Dense, DO  glucose blood (WAVESENSE PRESTO) test strip Use as instructed - Dispense QS for up to twice daily testing. 01/02/14   Moses Manners, MD  hydrochlorothiazide (MICROZIDE) 12.5 MG capsule Take 1 capsule (12.5 mg total) by mouth daily. Patient not taking: Reported  on 01/24/2019 07/09/18   Ellwood Dense, DO  insulin degludec (TRESIBA) 100 UNIT/ML SOPN FlexTouch Pen Inject 0.1 mLs (10 Units total) into the skin daily. Patient not taking: Reported on 01/24/2019 11/02/18   Ellwood Dense, DO  Insulin Pen Needle 32G X 4 MM MISC Use with [name] pen. Change needles with each use. 07/09/18   Ellwood Dense, DO  Lancets 30G MISC 1 each by Does not apply route 3 (three) times daily. 11/02/18   Ellwood Dense, DO    methocarbamol (ROBAXIN) 750 MG tablet Take 1-2 tablets (750-1,500 mg total) by mouth 3 (three) times daily as needed for muscle spasms. Patient not taking: Reported on 06/22/2018 02/15/18   Cristina Gong, PA-C  terbinafine (LAMISIL) 250 MG tablet Take 1 tablet (250 mg total) by mouth daily. Patient not taking: Reported on 02/15/2018 02/02/18   Shirley, [place], DO    Family History Family History  Problem Relation Age of Onset  . Cancer Mother   . Diabetes Mother   . Hypertension Mother   . Diabetes Sister   . Cancer Maternal Grandmother   . Cancer Son   . Cancer Maternal Aunt   . Heart attack Neg Hx   . Stroke Neg Hx     Social History Social History   Tobacco Use  . Smoking status: Never Smoker  . Smokeless tobacco: Never Used  Substance Use Topics  . Alcohol use: No    Alcohol/week: 1.0 standard drinks    Types: 1 drink(s) per week    Comment: 2014 quit, was drinking 2  beers daily at that time  . Drug use: No     Allergies   Patient has no known allergies.   Review of Systems Review of Systems  Constitutional: Negative for diaphoresis, fatigue and fever.  HENT: Negative for congestion and trouble swallowing.   Eyes: Negative for visual disturbance.  Respiratory: Negative for cough, chest tightness, shortness of breath, wheezing and stridor.   Cardiovascular: Positive for chest pain. Negative for palpitations, claudication, leg swelling and syncope.  Gastrointestinal: Positive for heartburn. Negative for abdominal pain, anorexia, constipation, diarrhea, nausea and vomiting.  Genitourinary: Negative for flank pain and frequency.  Musculoskeletal: Negative for back pain, neck pain and neck stiffness.  Skin: Negative for rash and wound.  Neurological: Negative for dizziness, weakness, light-headedness, numbness and headaches.  All other systems reviewed and are negative.    Physical Exam Updated Vital Signs BP (!) 183/118 (BP Location: Right Arm)   Pulse 84    Temp 98.5 F (36.9 C) (Oral)   Resp 20   Ht 5\' 8"  (1.727 m)   Wt 113.4 kg   SpO2 99%   BMI 38.01 kg/m   Physical Exam Vitals signs and nursing note reviewed.  Constitutional:      General: He is not in acute distress.    Appearance: He is well-developed. He is not ill-appearing, toxic-appearing or diaphoretic.  HENT:     Head: Normocephalic and atraumatic.  Eyes:     Conjunctiva/sclera: Conjunctivae normal.     Pupils: Pupils are equal, round, and reactive to light.  Neck:     Musculoskeletal: Neck supple.  Cardiovascular:     Rate and Rhythm: Normal rate and regular rhythm.     Heart sounds: No murmur.  Pulmonary:     Effort: Pulmonary effort is normal. No tachypnea or respiratory distress.     Breath sounds: Normal breath sounds. No stridor. No decreased breath sounds, wheezing, rhonchi or rales.  Chest:  Chest wall: Tenderness present.    Abdominal:     Palpations: Abdomen is soft.     Tenderness: There is no abdominal tenderness.  Musculoskeletal: Normal range of motion.     Right lower leg: He exhibits no tenderness. No edema.     Left lower leg: He exhibits no tenderness. No edema.  Skin:    General: Skin is warm and dry.     Capillary Refill: Capillary refill takes less than 2 seconds.     Coloration: Skin is not pale.  Neurological:     General: No focal deficit present.     Mental Status: He is alert.      ED Treatments / Results  Labs (all labs ordered are listed, but only abnormal results are displayed) Labs Reviewed  BASIC METABOLIC PANEL - Abnormal; Notable for the following components:      Result Value   Glucose, Bld 264 (*)    BUN 23 (*)    Creatinine, Ser 1.44 (*)    GFR calc non Af Amer 57 (*)    All other components within normal limits  D-DIMER, QUANTITATIVE (NOT AT [facility]) - Abnormal; Notable for the following components:   D-Dimer, Quant 1.04 (*)    All other components within normal limits  CBC  LIPASE, BLOOD  HEPATIC FUNCTION  PANEL  I-STAT TROPONIN, ED  POCT I-STAT TROPONIN I  POCT I-STAT TROPONIN I  I-STAT TROPONIN, ED    EKG EKG Interpretation  Date/Time:  Thursday January 24 2019 10:47:47 EST Ventricular Rate:  79 PR Interval:    QRS Duration: 88 QT Interval:  377 QTC Calculation: 433 R Axis:   -40 Text Interpretation:  Sinus rhythm Left anterior fascicular block Abnormal R-wave progression, late transition When compared to prior, slower rate.  No STEMI Confirmed by Theda Belfastegeler, Chris ([id number]) on 01/24/2019 11:43:19 AM   Radiology Dg Chest 2 View  Result Date: 01/24/2019 CLINICAL DATA:  Left-sided chest pain for 2 days. EXAM: CHEST - 2 VIEW COMPARISON:  07/25/2017 FINDINGS: The heart size and mediastinal contours are within normal limits. Both lungs are clear. No pleural effusion or pneumothorax. The visualized skeletal structures are unremarkable. IMPRESSION: Normal chest radiographs. Electronically Signed   By: Amie Portlandavid  Ormond M.D.   On: 01/24/2019 12:12   Ct Angio Chest Pe W And/or Wo Contrast  Result Date: 01/24/2019 CLINICAL DATA:  Chest pain. EXAM: CT ANGIOGRAPHY CHEST WITH CONTRAST TECHNIQUE: Multidetector CT imaging of the chest was performed using the standard protocol during bolus administration of intravenous contrast. Multiplanar CT image reconstructions and MIPs were obtained to evaluate the vascular anatomy. CONTRAST:  100mL ISOVUE-370 IOPAMIDOL (ISOVUE-370) INJECTION 76% COMPARISON:  Radiographs of same day. FINDINGS: Cardiovascular: Satisfactory opacification of the pulmonary arteries to the segmental level. No evidence of pulmonary embolism. Normal heart size. No pericardial effusion. Mediastinum/Nodes: No enlarged mediastinal, hilar, or axillary lymph nodes. Thyroid gland, trachea, and esophagus demonstrate no significant findings. Lungs/Pleura: Lungs are clear. No pleural effusion or pneumothorax. Upper Abdomen: No acute abnormality. Musculoskeletal: No chest wall abnormality. No acute or  significant osseous findings. Review of the MIP images confirms the above findings. IMPRESSION: No definite evidence of pulmonary embolus. No acute cardiopulmonary abnormality seen. Electronically Signed   By: Lupita RaiderJames  Green Jr, M.D.   On: 01/24/2019 14:29    Procedures Procedures (including critical care time)  Medications Ordered in ED Medications  sodium chloride (PF) 0.9 % injection (has no administration in time range)  iopamidol (ISOVUE-370) 76 % injection (has no  administration in time range)  sodium chloride flush (NS) 0.9 % injection 3 mL (3 mLs Intravenous Given 01/24/19 1141)  alum & mag hydroxide-simeth (MAALOX/MYLANTA) 200-200-20 MG/5ML suspension 30 mL (30 mLs Oral Given 01/24/19 1219)    And  lidocaine (XYLOCAINE) 2 % viscous mouth solution 15 mL (15 mLs Oral Given 01/24/19 1218)  iopamidol (ISOVUE-370) 76 % injection 100 mL (100 mLs Intravenous Contrast Given 01/24/19 1401)     Initial Impression / Assessment and Plan / ED Course  I have reviewed the triage vital signs and the nursing notes.  Pertinent labs & imaging results that were available during my care of the patient were reviewed by me and considered in my medical decision making (see chart for details).  Clinical Course as of Jan 25 1556  Thu Jan 24, 2019  1549 Delta trop, CTA neg, home after 2nd trop   [MB]    Clinical Course User Index [MB] Sabas Sous, MD    Cilas Leroy is a 48 y.o. male with a past medical history significant for obesity, hypertension, hyperlipidemia, and diabetes who presents with chest pain.  Patient reports that for the last 3 days he has been having left-sided chest pain going to his left shoulder.  He reports it is not exertional but it is occasionally pleuritic.  He reports that it started after he ate some chicken.  He reports he has had a history of reflux in the past and thought it was heartburn.  He took Tums without significant relief.  He reports no nausea, vomiting,  diaphoresis, shortness of breath, palpitations, lightheadedness, nausea, vomiting, or syncope.  He denies recent trauma.  He reports the pain is worsened when he moves his left arm and shoulder around and palpates on his left pectoral muscle.  He thinks it may be muscular and cramping in nature.  He has had muscle spasm before.  He denies any constipation, diarrhea, or urinary symptoms.  No recent leg pain or leg swelling.  No history of DVT or PE.  He denies other complaints on arrival.  On exam, left chest is tender to palpation.  No murmur.  Lungs were clear.  Left shoulder was tender to palpation with movement.  Normal radial pulses.  Normal lower extremity pulses.  No significant edema in the legs.  Back nontender.  Patient otherwise resting comfortably.  Blood pressure elevated on arrival.  Clinical aspect patient has musculoskeletal pain in his left chest wall causing his symptoms however patient will have work-up to rule out a cardiac or pulmonary etiology.  We will give GI cocktail given the onset with some chicken.  Patient's EKG shows no STEMI.  Due to the pleuritic component of pain, d-dimer will be obtained.  Anticipate reassessment after diagnostic work-up.  Patient will have delta troponin.  Anticipate discharge if work-up is reassuring.  Patient PE study was negative.  Patient is awaiting delta troponin.  Second troponin was negative.  Given patient's reassuring work-up, low suspicion for cardiac etiology.  Patient will be discharged for outpatient follow-up.   Final Clinical Impressions(s) / ED Diagnoses   Final diagnoses:  Precordial pain  Muscle spasm    ED Discharge Orders         Ordered    cyclobenzaprine (FLEXERIL) 10 MG tablet  2 times daily PRN     01/24/19 1601         Clinical Impression: 1. Precordial pain   2. Muscle spasm     Disposition: Discharge  Condition:  Good  I have discussed the results, Dx and Tx plan with the pt(& family if present).  He/she/they expressed understanding and agree(s) with the plan. Discharge instructions discussed at great length. Strict return precautions discussed and pt &/or family have verbalized understanding of the instructions. No further questions at time of discharge.    Discharge Medication List as of 01/24/2019  4:02 PM    START taking these medications   Details  cyclobenzaprine (FLEXERIL) 10 MG tablet Take 1 tablet (10 mg total) by mouth 2 (two) times daily as needed for muscle spasms., Starting Thu 01/24/2019, Print        Follow Up: [facility] [address] [place] [id number] [phone] Schedule an appointment as soon as possible for a visit    [facility] [address] [id number] [place] [id number]       Tegeler, Canary Brimhristopher J, MD 01/24/19 1929

## 2019-02-21 ENCOUNTER — Other Ambulatory Visit: Payer: Self-pay | Admitting: Family Medicine

## 2019-04-13 ENCOUNTER — Other Ambulatory Visit: Payer: Self-pay | Admitting: Family Medicine

## 2019-05-21 ENCOUNTER — Other Ambulatory Visit: Payer: Self-pay | Admitting: Family Medicine

## 2019-06-25 ENCOUNTER — Other Ambulatory Visit: Payer: Self-pay | Admitting: Family Medicine

## 2019-06-25 NOTE — Telephone Encounter (Signed)
Will provide refill once patient makes appt. Please help him schedule this. Thanks!

## 2019-06-26 NOTE — Telephone Encounter (Signed)
Patient informed and scheduled an appointment on 07-04-2019.  Will need a refill to last until his appointment. Coyt Govoni,CMA

## 2019-07-04 ENCOUNTER — Ambulatory Visit: Payer: Self-pay | Admitting: Family Medicine

## 2019-07-04 NOTE — Progress Notes (Deleted)
  Subjective:   Patient ID: Patrick [name]    DOB: [birth year], 48 y.o. male   MRN: [id number]  Patrick [name] is a 48 y.o. male with a history of HTN, T2DM, HLD, obesity here for   Diabetes, Type 2 - Last A1c 10.2 10/2018 - Medications: glipizide, tresiba 10u, metformin 1000mg  BID - Compliance: *** - Checking BG at home: *** - Diet: *** - Exercise: *** - Eye exam: due - Foot exam: due - Microalbumin: n/a - Statin: yes - Denies symptoms of hypoglycemia, polyuria, polydipsia, numbness extremities, foot ulcers/trauma  ***HIV screening, tdap ***taking statin?  Review of Systems:  Per HPI.  [place], medications and smoking status reviewed.  Objective:   There were no vitals taken for this visit. Vitals and nursing note reviewed.  General: well nourished, well developed, in no acute distress with non-toxic appearance HEENT: normocephalic, atraumatic, moist mucous membranes Neck: supple, non-tender without lymphadenopathy CV: regular rate and rhythm without murmurs, rubs, or gallops, no lower extremity edema Lungs: clear to auscultation bilaterally with normal work of breathing Abdomen: soft, non-tender, non-distended, no masses or organomegaly palpable, normoactive bowel sounds Skin: warm, dry, no rashes or lesions Extremities: warm and well perfused, normal tone MSK: ROM grossly intact, strength intact, gait normal Neuro: Alert and oriented, speech normal  Assessment & Plan:   No problem-specific Assessment & Plan notes found for this encounter.  No orders of the defined types were placed in this encounter.  No orders of the defined types were placed in this encounter.   Rory Percy, DO PGY-3, [facility] 07/04/2019 9:25 AM

## 2019-07-09 ENCOUNTER — Ambulatory Visit: Payer: Self-pay | Admitting: Family Medicine

## 2019-07-09 NOTE — Progress Notes (Deleted)
  Subjective:   Patient ID: Patrick [name]    DOB: 04/05/1971, 48 y.o. male   MRN: [id number]  Patrick [name] is a 48 y.o. male with a history of HTN, T2DM, HLD, obesity here for   Diabetes, Type 2 - Last A1c 10.2 10/2018 - Medications: metformin 1000mg  BID, glipizide 10mg , tresiba 10u daily - Compliance: *** - Checking BG at home: *** - Diet: *** - Exercise: *** - Eye exam: due - Foot exam: due - Microalbumin: n/a - Statin: yes - Denies symptoms of hypoglycemia, polyuria, polydipsia, numbness extremities, foot ulcers/trauma  Hypertension: - Medications: amlodipine 5mg , HCTZ 12.5mg ***, lisinopril-HCTZ 20-25mg  - Compliance: *** - Checking BP at home: *** - Denies any SOB, CP, vision changes, LE edema, medication SEs, or symptoms of hypotension - Diet: *** - Exercise: ***  ***needs tdap ***previously with the MAP program ***rosuva vs atorva?  Review of Systems:  Per HPI.  [place], medications and smoking status reviewed.  Objective:   There were no vitals taken for this visit. Vitals and nursing note reviewed.  General: well nourished, well developed, in no acute distress with non-toxic appearance HEENT: normocephalic, atraumatic, moist mucous membranes Neck: supple, non-tender without lymphadenopathy CV: regular rate and rhythm without murmurs, rubs, or gallops, no lower extremity edema Lungs: clear to auscultation bilaterally with normal work of breathing Abdomen: soft, non-tender, non-distended, no masses or organomegaly palpable, normoactive bowel sounds Skin: warm, dry, no rashes or lesions Extremities: warm and well perfused, normal tone MSK: ROM grossly intact, strength intact, gait normal Neuro: Alert and oriented, speech normal  Assessment & Plan:   No problem-specific Assessment & Plan notes found for this encounter.  No orders of the defined types were placed in this encounter.  No orders of the defined types were placed in this encounter.   Patrick Percy, DO PGY-3, [facility] 07/09/2019 7:44 AM

## 2019-07-17 ENCOUNTER — Ambulatory Visit: Payer: Self-pay | Admitting: Family Medicine

## 2019-07-17 NOTE — Progress Notes (Deleted)
  Subjective:   Patient ID: Patrick [name]    DOB: 19-Aug-1971, 48 y.o. male   MRN: [id number]  Patrick [name] is a 48 y.o. male with a history of T2DM, HTN, HLD, obesity here for   Diabetes, Type 2 - Last A1c 10.2 10/2018 - Medications: glipizide 10mg  BID, tresiba 10u qhs, metformin 1000mg  BID - Compliance: *** - Checking BG at home: *** - Diet: *** - Exercise: *** - Eye exam: due - Foot exam: due - Microalbumin: n/a - Statin: yes - Denies symptoms of hypoglycemia, polyuria, polydipsia, numbness extremities, foot ulcers/trauma  Hypertension: - Medications: amlodipine 5mg , HCTZ 12.5mg ***, lisinopril-HCTZ 20-25mg  daily - Compliance: *** - Checking BP at home: *** - Denies any SOB, CP, vision changes, LE edema, medication SEs, or symptoms of hypotension - Diet: *** - Exercise: ***  ***eye exam, foot exam ***HIV, tdap  Review of Systems:  Per HPI.  [place], medications and smoking status reviewed.  Objective:   There were no vitals taken for this visit. Vitals and nursing note reviewed.  General: well nourished, well developed, in no acute distress with non-toxic appearance HEENT: normocephalic, atraumatic, moist mucous membranes Neck: supple, non-tender without lymphadenopathy CV: regular rate and rhythm without murmurs, rubs, or gallops, no lower extremity edema Lungs: clear to auscultation bilaterally with normal work of breathing Abdomen: soft, non-tender, non-distended, no masses or organomegaly palpable, normoactive bowel sounds Skin: warm, dry, no rashes or lesions Extremities: warm and well perfused, normal tone MSK: ROM grossly intact, strength intact, gait normal Neuro: Alert and oriented, speech normal  Assessment & Plan:   No problem-specific Assessment & Plan notes found for this encounter.  No orders of the defined types were placed in this encounter.  No orders of the defined types were placed in this encounter.   Rory Percy, DO PGY-3, [facility] 07/17/2019 7:59 AM

## 2019-07-18 ENCOUNTER — Ambulatory Visit: Payer: Self-pay | Admitting: Family Medicine

## 2019-07-22 ENCOUNTER — Other Ambulatory Visit: Payer: Self-pay

## 2019-07-22 ENCOUNTER — Encounter: Payer: Self-pay | Admitting: Family Medicine

## 2019-07-22 ENCOUNTER — Ambulatory Visit (INDEPENDENT_AMBULATORY_CARE_PROVIDER_SITE_OTHER): Payer: Self-pay | Admitting: Family Medicine

## 2019-07-22 VITALS — BP 156/92 | HR 92

## 2019-07-22 DIAGNOSIS — N189 Chronic kidney disease, unspecified: Secondary | ICD-10-CM

## 2019-07-22 DIAGNOSIS — E119 Type 2 diabetes mellitus without complications: Secondary | ICD-10-CM

## 2019-07-22 DIAGNOSIS — I10 Essential (primary) hypertension: Secondary | ICD-10-CM

## 2019-07-22 DIAGNOSIS — Z6838 Body mass index (BMI) 38.0-38.9, adult: Secondary | ICD-10-CM

## 2019-07-22 LAB — POCT GLYCOSYLATED HEMOGLOBIN (HGB A1C): HbA1c, POC (controlled diabetic range): 10 % — AB (ref 0.0–7.0)

## 2019-07-22 MED ORDER — INSULIN GLARGINE 100 UNITS/ML SOLOSTAR PEN: 10.0000 [IU] | PEN_INJECTOR | Freq: Every day | SUBCUTANEOUS | 0 refills | Status: DC

## 2019-07-22 MED ORDER — HYDROCHLOROTHIAZIDE 12.5 MG PO CAPS: 12.5000 mg | ORAL_CAPSULE | Freq: Every day | ORAL | 3 refills | Status: DC

## 2019-07-22 MED ORDER — LISINOPRIL 10 MG PO TABS: 10.0000 mg | ORAL_TABLET | Freq: Every day | ORAL | 3 refills | Status: DC

## 2019-07-22 NOTE — Patient Instructions (Signed)
It was great to see you!  Our plans for today:  - Work on eating more vegetables and less fried and fatty foods. NO sugar-sweetened beverages.  - Make an appointment to see our pharmacist, Dr. Valentina Lucks, for further titration of your diabetes medication.  - Take lisinopril 10mg  IN ADDITION to your lisinopril-HCTZ 20-25mg  - Make an appointment with Kennyth Lose to see about getting an orange card. This can help you pay for studies and doctor appointments. - Come back in 1 month.  We are checking some labs today, we will call you or send you a letter if they are abnormal.   Take care and seek immediate care sooner if you develop any concerns.   Dr. [facility]  Here is an example of what a healthy plate looks like:    ? Make half your plate fruits and vegetables.     ? Focus on whole fruits.     ? Vary your veggies.  ? Make half your grains whole grains. -     ? Look for the word "whole" at the beginning of the ingredients list    ? Some whole-grain ingredients include whole oats, whole-wheat flour,        whole-grain corn, whole-grain brown rice, and whole rye.  ? Move to low-fat and fat-free milk or yogurt.  ? Vary your protein routine. - Meat, fish, poultry (chicken, [place]), eggs, beans (kidney, pinto), dairy.  ? Drink and eat less sodium, saturated fat, and added sugars.

## 2019-07-22 NOTE — Assessment & Plan Note (Signed)
Uncontrolled, A1c 10.0 today.  Patient has repeated difficulties obtaining medications due to lack of insurance and financial difficulties.  Previously was a part of the MAP program however finds paperwork to be a barrier to enrolling with this again.  Provided Lantus samples today.  Will send message to pharmacy to see if patient can be enrolled in a drug company's patient assistance program in better attempt to obtain his medications.  Follow-up in 1 month.

## 2019-07-22 NOTE — Assessment & Plan Note (Signed)
Contributing to uncontrolled hypertension and diabetes.  Advised weight loss through lifestyle changes such as diet and exercise.

## 2019-07-22 NOTE — Progress Notes (Signed)
Subjective:   Patient ID: Patrick [name]    DOB: 05/20/1971, 48 y.o. male   MRN: [id number]  Patrick [name] is a 48 y.o. male with a history of HTN, T2DM, obesity, HLD here for   Diabetes, Type 2 - Last A1c 10.2 10/2018 - Medications: tresiba 10u daily, metformin 1000mg  BID, glipizide 10mg  BID - Compliance: haven't been using tresiba, difficulties affording. - Checking BG at home: no, doesn't have meter anymore - Diet: woke up at 8am, Breakfast at 10am (bacon and eggs, no sugar added juice), chips for snack, dinner at 7pm (fried chicken and beans), peanut butter crackers at 9pm. Went to bed at 3am.  - Exercise: none - Eye exam: no - Foot exam: due - Microalbumin: n/a - Statin: yes - Denies symptoms of hypoglycemia, polyuria, polydipsia, foot ulcers/trauma - some numbness and tingling in extremities  Hypertension: - Medications: lisinopril-HCTZ 20-25mg  daily, norvasc 5mg  daily - Compliance: good - Checking BP at home: no - Denies any SOB, CP, medication SEs, or symptoms of hypotension - Diet: see above - Exercise: see above - does have some snoring, sometimes wakes up gasping for air. - endorses some LE edema, wearing compression socks essentially every day  Review of Systems:  Per HPI.  PMFSH, medications and smoking status reviewed.  Objective:   BP (!) 156/92   Pulse 92   SpO2 97%  Vitals and nursing note reviewed.  General: Obese male, in no acute distress with non-toxic appearance CV: regular rate and rhythm without murmurs, rubs, or gallops, trace lower extremity edema Lungs: clear to auscultation bilaterally with normal work of breathing Abdomen: soft, non-tender, non-distended, normoactive bowel sounds Skin: warm, dry, no rashes or lesions Extremities: warm and well perfused, normal tone.  Foot exam completed with decreased sensation R>L, intact DP pulses bilaterally. MSK: ROM grossly intact, strength intact, gait normal Neuro: Alert and oriented, speech normal   Assessment & Plan:   HYPERTENSION, BENIGN ESSENTIAL Elevated today despite taking medications this morning.  Will add additional lisinopril 10 mg to combo lisinopril-HCTZ.  Will obtain BMP.  Ideally would also obtain sleep study however patient does not currently have insurance, will get him set up with Annice PihJackie to see about getting an orange card.  OBESITY Contributing to uncontrolled hypertension and diabetes.  Advised weight loss through lifestyle changes such as diet and exercise.  Diabetes mellitus without complication Uncontrolled, A1c 10.0 today.  Patient has repeated difficulties obtaining medications due to lack of insurance and financial difficulties.  Previously was a part of the MAP program however finds paperwork to be a barrier to enrolling with this again.  Provided Lantus samples today.  Will send message to pharmacy to see if patient can be enrolled in a drug company's patient assistance program in better attempt to obtain his medications.  Follow-up in 1 month.  Orders Placed This Encounter  Procedures  . Basic Metabolic Panel  . Ambulatory referral to Ophthalmology    Referral Priority:   Routine    Referral Type:   Consultation    Referral Reason:   Specialty Services Required    Requested Specialty:   Ophthalmology    Number of Visits Requested:   1  . HgB A1c   Meds ordered this encounter  Medications  . insulin glargine (LANTUS) 100 unit/mL SOPN    Sig: Inject 0.1 mLs (10 Units total) into the skin daily.    Dispense:  6 mL    Refill:  0    Order Specific Question:  Lot Number?    Answer:   [id number]    Order Specific Question:   Expiration Date?    Answer:   08/11/2021    Order Specific Question:   NDC    Answer:   7116-5790-38 [[id number]]    Order Specific Question:   Quantity    Answer:   2  . DISCONTD: hydrochlorothiazide (MICROZIDE) 12.5 MG capsule    Sig: Take 1 capsule (12.5 mg total) by mouth daily.    Dispense:  90 capsule    Refill:  3  . lisinopril  (ZESTRIL) 10 MG tablet    Sig: Take 1 tablet (10 mg total) by mouth at bedtime.    Dispense:  90 tablet    Refill:  [place], DO PGY-3, [facility] 07/22/2019 4:38 PM

## 2019-07-22 NOTE — Assessment & Plan Note (Signed)
Elevated today despite taking medications this morning.  Will add additional lisinopril 10 mg to combo lisinopril-HCTZ.  Will obtain BMP.  Ideally would also obtain sleep study however patient does not currently have insurance, will get him set up with Kennyth Lose to see about getting an orange card.

## 2019-07-23 ENCOUNTER — Telehealth: Payer: Self-pay | Admitting: *Deleted

## 2019-07-23 LAB — BASIC METABOLIC PANEL
BUN/Creatinine Ratio: 9 (ref 9–20)
BUN: 16 mg/dL (ref 6–24)
CO2: 27 mmol/L (ref 20–29)
Calcium: 9.4 mg/dL (ref 8.7–10.2)
Chloride: 97 mmol/L (ref 96–106)
Creatinine, Ser: 1.81 mg/dL — ABNORMAL HIGH (ref 0.76–1.27)
GFR calc Af Amer: 50 mL/min/{1.73_m2} — ABNORMAL LOW (ref >59)
GFR calc non Af Amer: 43 mL/min/{1.73_m2} — ABNORMAL LOW (ref >59)
Glucose: 233 mg/dL — ABNORMAL HIGH (ref 65–99)
Potassium: 4.5 mmol/L (ref 3.5–5.2)
Sodium: 138 mmol/L (ref 134–144)

## 2019-07-23 NOTE — Addendum Note (Signed)
Addended by: Myles Gip on: 07/23/2019 08:42 AM   Modules accepted: Orders

## 2019-07-23 NOTE — Telephone Encounter (Signed)
Notes recorded by Rory Percy, DO on 07/23/2019 at 8:43 AM EDT  Called patient to discuss results but VM stated "Patrick [name]," left generic VM to call back.  His kidney function continues to worsen. He should come in for a lab visit to obtain a urine microalbumin (order placed).  I am also referring him to nephrology for establishment should he progress to needing dialysis.

## 2019-07-23 NOTE — Telephone Encounter (Signed)
Pt informed of note from MD.  Has no additional questions.  Will see if he can get a ride to come to lab.  Christen Bame, CMA

## 2019-07-23 NOTE — Telephone Encounter (Signed)
Pt is returning call to Dr. Ky Barban.

## 2019-07-24 ENCOUNTER — Other Ambulatory Visit (INDEPENDENT_AMBULATORY_CARE_PROVIDER_SITE_OTHER): Payer: Self-pay

## 2019-07-24 ENCOUNTER — Other Ambulatory Visit: Payer: Self-pay

## 2019-07-24 ENCOUNTER — Encounter: Payer: Self-pay | Admitting: Family Medicine

## 2019-07-24 DIAGNOSIS — N189 Chronic kidney disease, unspecified: Secondary | ICD-10-CM

## 2019-07-24 DIAGNOSIS — N183 Chronic kidney disease, stage 3 unspecified: Secondary | ICD-10-CM | POA: Insufficient documentation

## 2019-07-24 LAB — MICROALBUMIN / CREATININE URINE RATIO
Albumin/Creatinine Ratio, Urine, POC: >300
Albumin: 150
Creatinine, Ur: 200 mg/dL

## 2019-07-24 MED ORDER — INSULIN PEN NEEDLE 32G X 4 MM MISC: 2 refills | Status: DC

## 2019-07-24 NOTE — Telephone Encounter (Signed)
Awesome, thanks.

## 2019-07-24 NOTE — Addendum Note (Signed)
Addended by: Myles Gip on: 07/24/2019 06:13 PM   Modules accepted: Orders

## 2019-07-25 ENCOUNTER — Telehealth: Payer: Self-pay | Admitting: Pharmacist

## 2019-07-25 NOTE — Telephone Encounter (Signed)
Called patient - left message and he returned call within the hour.   States he has been asked to work through [employer] application process and get a denial letter prior to proceeding with MAP paperwork at the health department.   Scheduled an appointment with Rx clinic on Tuesday 8/18 at 9: 00 AM to discuss options including short-term supply with samples.

## 2019-07-25 NOTE — Telephone Encounter (Signed)
-----   Message from Rory Percy, DO sent at 07/22/2019  4:23 PM EDT ----- Hey Dr. Valentina Lucks,  This patient with uncontrolled diabetes and historically has issues affording his medications. He was previously on the MAP program but hasn't been able to re-enroll because he has had issues with his paperwork.  I was wondering if we could somehow get him enrolled with a patient assistance program through a drug company (he would likely be a great candidate for a GLP-1 agonist or SGLT2 inhibitor) or somehow get him help with filling out the paperwork for the MAP program (like [employer] or something?).   Let me know your thoughts! Thanks! Bryson Ha

## 2019-07-25 NOTE — Telephone Encounter (Signed)
Attempted to call patient at only number in chart.  Left message on voice mail for "Patrick [name]" for the patient to return a call directly to me.   Antcipate discussing indigent med supply options with patient at a visit in the near future.

## 2019-07-30 ENCOUNTER — Ambulatory Visit (INDEPENDENT_AMBULATORY_CARE_PROVIDER_SITE_OTHER): Payer: Self-pay | Admitting: Pharmacist

## 2019-07-30 ENCOUNTER — Other Ambulatory Visit: Payer: Self-pay

## 2019-07-30 ENCOUNTER — Encounter: Payer: Self-pay | Admitting: Pharmacist

## 2019-07-30 DIAGNOSIS — E119 Type 2 diabetes mellitus without complications: Secondary | ICD-10-CM

## 2019-07-30 DIAGNOSIS — I10 Essential (primary) hypertension: Secondary | ICD-10-CM

## 2019-07-30 MED ORDER — INSULIN GLARGINE 100 UNITS/ML SOLOSTAR PEN: 10.0000 [IU] | PEN_INJECTOR | Freq: Every day | SUBCUTANEOUS | 0 refills | Status: DC

## 2019-07-30 MED ORDER — AMLODIPINE BESYLATE 10 MG PO TABS: 10.0000 mg | ORAL_TABLET | Freq: Every day | ORAL | 3 refills | Status: DC

## 2019-07-30 NOTE — Assessment & Plan Note (Signed)
Diabetes currently uncontrolled. Patient verbalized appropriate hypoglycemia management plan. Patient appears adherent with medication. Control is suboptimal due to not checking blood sugars and not being able to afford medications. -Continued basal insulin Lantus (insulin glargine) 10 units daily.   -Extensively discussed pathophysiology of DM, recommended lifestyle interventions, dietary effects on glycemic control -Counseled on s/sx of and management of hypoglycemia -Next A1C anticipated next 3 months.  -At next visit, consider adding SGLT2 inhibitor for additional control.

## 2019-07-30 NOTE — Assessment & Plan Note (Signed)
Hypertension currently uncontrolled.  BP goal = <13/80 mmHg. Patient reports adherence with medication. -increased dose of amlodipine from 5mg  to 10mg  daily at night.

## 2019-07-30 NOTE — Progress Notes (Signed)
Reviewed: I agree with Dr. Koval's documentation and management. 

## 2019-07-30 NOTE — Progress Notes (Signed)
S:     No chief complaint on file.   Patient arrives ambulating and in good spirits.  Presents for diabetes evaluation, education, and management Patient was referred and last seen by Primary Care Provider Dr. Ellwood DenseAlison Rumball on 07/22/19.   Insurance coverage/medication affordability: Patient does not have insurance. Will be working with Annice PihJackie to determine if he qualifies for Medicaid or can get an orange card.  Patient reports adherence with medications.  Current diabetes medications include: glipizide 10mg  bid, Lantus 10 units daily, metformin 1000mg  bid,  Current hypertension medications include: amlodipine 5mg  daily, lisinopril 10mg  daily, lisinopril-hctz 20-25mg  daily Current hyperlipidemia medications include: not taking any due to high cost  Patient denies hypoglycemic events.  Patient reported dietary habits:  Eating less salty food, less bread, more fruits (apples and bananas)   Patient-reported exercise habits:  Patient denies exercise, but states that he needs to exercise more.   Patient reports nocturia about 3 days out of the week.      O:  Physical Exam Musculoskeletal:     Right lower leg: No edema.     Left lower leg: No edema.    Review of Systems  All other systems reviewed and are negative.    Lab Results  Component Value Date   HGBA1C 10.0 (A) 07/22/2019   Vitals:   07/30/19 0921  BP: (!) 160/100  Pulse: 70  SpO2: 98%    Lipid Panel     Component Value Date/Time   CHOL 167 07/09/2018 1203   TRIG 282 (H) 07/09/2018 1203   HDL 27 (L) 07/09/2018 1203   CHOLHDL 6.2 (H) 07/09/2018 1203   CHOLHDL 7.2 10/31/2014 1540   VLDL 72 (H) 10/31/2014 1540   LDLCALC 84 07/09/2018 1203   Patient does not check blood glucose at home as he does not have a meter.   Clinical ASCVD: No  The 10-year ASCVD risk score Denman George(Goff DC Jr., et al., 2013) is: 25.3%   Values used to calculate the score:     Age: 48 years     Sex: Male     Is Non-Hispanic  African American: Yes     Diabetic: Yes     Tobacco smoker: No     Systolic Blood Pressure: 160 mmHg     Is BP treated: Yes     HDL Cholesterol: 27 mg/dL     Total Cholesterol: 167 mg/dL    A/P: Diabetes currently uncontrolled. Patient verbalized appropriate hypoglycemia management plan. Patient appears adherent with medication. Control is suboptimal due to not checking blood sugars and not being able to afford medications. -Continued basal insulin Lantus (insulin glargine) 10 units daily.   -Extensively discussed pathophysiology of DM, recommended lifestyle interventions, dietary effects on glycemic control -Counseled on s/sx of and management of hypoglycemia -Next A1C anticipated next 3 months.  -At next visit, consider adding SGLT2 inhibitor for additional control.  ASCVD risk - primary prevention in patient with DM. Last LDL is not controlled. ASCVD risk score is >20%  - high intensity statin indicated. Aspirin is indicated.  -Continued aspirin 81 mg  -Patient reports that he cannot afford statin, so is not taking it.   Hypertension currently uncontrolled.  BP goal = <13/80 mmHg. Patient reports adherence with medication. -increased dose of amlodipine from 5mg  to 10mg  daily at night.   Written patient instructions provided.  Total time in face to face counseling 35 minutes.   Follow up PCP Dr. Linwood Dibblesumball with Clinic Visit in 2 weeks.  Patient seen with Murlean Iba, PharmD Candidate and Lorel Monaco, PharmD PGY-1 Resident.

## 2019-07-30 NOTE — Patient Instructions (Signed)
Great to see you today.   Plan: Increase Amlodipine to 10 mg at night  Meet with Kennyth Lose next week (schedule a meeting)  Meet with Dr. Ky Barban in 2-3 weeks regarding blood glucose monitor and blood pressure.  Meet with Dr. Valentina Lucks by end of September regarding diabetes.  Please call if you have any questions.

## 2019-08-02 ENCOUNTER — Telehealth: Payer: Self-pay | Admitting: *Deleted

## 2019-08-02 NOTE — Telephone Encounter (Signed)
Pt wanted to check the status of the nephrology referral.   Gave number to call and check . Christen Bame, CMA

## 2019-08-02 NOTE — Telephone Encounter (Signed)
Pt LM on nurse line requesting a callback, no other info left.  Attempted to call back, no answer and unidentifiable machine.  Will await callback. Christen Bame, CMA

## 2019-09-13 ENCOUNTER — Other Ambulatory Visit: Payer: Self-pay | Admitting: Family Medicine

## 2019-11-27 ENCOUNTER — Other Ambulatory Visit: Payer: Self-pay | Admitting: Family Medicine

## 2019-11-28 NOTE — Telephone Encounter (Signed)
error 

## 2020-02-09 ENCOUNTER — Other Ambulatory Visit: Payer: Self-pay | Admitting: Family Medicine

## 2020-02-09 DIAGNOSIS — I10 Essential (primary) hypertension: Secondary | ICD-10-CM

## 2020-02-12 NOTE — Telephone Encounter (Signed)
Appt made for 02-26-2020 at 9:30am.  [facility]

## 2020-02-26 ENCOUNTER — Ambulatory Visit: Payer: Self-pay | Admitting: Family Medicine

## 2020-02-26 NOTE — Progress Notes (Deleted)
    SUBJECTIVE:   CHIEF COMPLAINT: questions, DM f/u  HPI:   Diabetes, Type 2 - Last A1c 10.0 07/2019 - Medications: glipizide 10mg  BID, lantus 10u daily, metformin 1000mg  BID - Compliance: *** - Checking BG at home: *** - Diet: *** - Exercise: *** - Eye exam: ordered - Foot exam: UTD - Microalbumin: UTD - Statin: prescribed, *** - Denies symptoms of hypoglycemia, polyuria, polydipsia, numbness extremities, foot ulcers/trauma  Hypertension: - Medications: amlodipine 10mg  daily, lisinopril-HCTZ 20-25mg  daily - Compliance: *** - Checking BP at home: *** - Denies any SOB, CP, vision changes, LE edema, medication SEs, or symptoms of hypotension - Diet: *** - Exercise: ***  CKD3 - previously referred to [facility] but they have been unable to contact him - ***  ***consider SGLT2 ***referred to [facility], unable to contact  PERTINENT  PMH / PSH: HTN, DM2, CKD3, obesity, HLD  OBJECTIVE:   There were no vitals taken for this visit.  ***  ASSESSMENT/PLAN:   No problem-specific Assessment & Plan notes found for this encounter.     , DO [place] [facility]

## 2020-03-04 ENCOUNTER — Ambulatory Visit (INDEPENDENT_AMBULATORY_CARE_PROVIDER_SITE_OTHER): Payer: Self-pay | Admitting: Family Medicine

## 2020-03-04 ENCOUNTER — Other Ambulatory Visit: Payer: Self-pay

## 2020-03-04 ENCOUNTER — Encounter: Payer: Self-pay | Admitting: Family Medicine

## 2020-03-04 VITALS — BP 161/80 | HR 76 | Wt 248.6 lb

## 2020-03-04 DIAGNOSIS — I10 Essential (primary) hypertension: Secondary | ICD-10-CM

## 2020-03-04 DIAGNOSIS — E119 Type 2 diabetes mellitus without complications: Secondary | ICD-10-CM

## 2020-03-04 LAB — POCT UA - MICROALBUMIN
Albumin/Creatinine Ratio, Urine, POC: >300
Creatinine, POC: 100 mg/dL
Microalbumin Ur, POC: 150 mg/L

## 2020-03-04 LAB — POCT GLYCOSYLATED HEMOGLOBIN (HGB A1C): HbA1c, POC (controlled diabetic range): 9.3 % — AB (ref 0.0–7.0)

## 2020-03-04 MED ORDER — ATORVASTATIN CALCIUM 40 MG PO TABS: 40.0000 mg | ORAL_TABLET | Freq: Every day | ORAL | 3 refills | Status: DC

## 2020-03-04 NOTE — Progress Notes (Signed)
    SUBJECTIVE:   CHIEF COMPLAINT / HPI: Diabetes follow-up  Diabetes, Type 2 - Last A1c 10.0 07/22/2019 - Medications: Glipizide 10 mg twice daily, Lantus 10 units daily, metformin 1000mg  BID - Compliance: not taking lantus due to expense.  Has had issues establishing with MAP program - Checking BG at home: no, reports higher blood sugar stress amount - Diet: cutting back on fried foods, sodas, bread.  - Eye exam: Ordered - Foot exam: UTD - Microalbumin: due - Statin: Ordered, could not afford - Denies symptoms of hypoglycemia, polyuria, polydipsia, numbness extremities, foot ulcers/trauma  Hypertension: - Medications: Amlodipine 10 mg daily, lisinopril-HCTZ 20-25 mg, lisinopril 10mg  daily - Compliance: started furosemide 20mg  daily a week ago per nephrology - saw nephrology last week, goes back next week.  Reportedly stopped HCTZ but told him it was okay to take combo medication. - Checking BP at home: yes, 150s SBP - Denies any SOB, CP, vision changes, LE edema, medication SEs, or symptoms of hypotension  PERTINENT  PMH / PSH: HTN, T2DM, CKD 3, HLD, obesity  OBJECTIVE:   BP (!) 161/80   Pulse 76   Wt 248 lb 9.6 oz (112.8 kg)   SpO2 98%   BMI 36.45 kg/m   GEN: Overweight, in NAD Cardiac: Regular rate, 1+ pitting LE edema under compression stockings Respiratory: Normal work of breathing on room air, appropriately saturated  ASSESSMENT/PLAN:   HYPERTENSION, BENIGN ESSENTIAL Elevated today although he reports he just took his blood pressure medication prior to his appointment.  Currently being managed by nephrology, has follow-up next week.  Encouraged patient to ask nephrology for clarification regarding HCTZ.  No medication changes made today.  Diabetes mellitus without complication Remains uncontrolled although improved 10.0>9.3 today.  Will start Jardiance 10 mg for additional control after receiving updated creatinine, will refer to pharmacy for medication assistance.   Encouraged patient to check blood sugars daily.  Updated lipid panel and BMP today.  Sent statin to pharmacy.    , DO [place] [facility]

## 2020-03-04 NOTE — Assessment & Plan Note (Addendum)
Remains uncontrolled although improved 10.0>9.3 today.  Will start Jardiance 10 mg for additional control after receiving updated creatinine, will refer to pharmacy for medication assistance.  Encouraged patient to check blood sugars daily.  Updated lipid panel and BMP today.  Sent statin to pharmacy.

## 2020-03-04 NOTE — Patient Instructions (Signed)
It was great to see you!  Our plans for today:  - You diabetes is better! Congratulations on making the diet changes. We are starting you on a new medication to help get even better control of your diabetes. You will get a call from our pharmacy team to help get you set up with this. - Check your blood sugars daily. - Check with your kidney doctor to see if they want you to stop taking the combo lisinopril-HCTZ medication. - I sent your cholesterol medication to the pharmacy. Let me know if this is too expensive. - Come back in 3 months.  We are checking some labs today, we will call you or send you a letter if they are abnormal.   Take care and seek immediate care sooner if you develop any concerns.   Dr. [facility]

## 2020-03-04 NOTE — Assessment & Plan Note (Signed)
Elevated today although he reports he just took his blood pressure medication prior to his appointment.  Currently being managed by nephrology, has follow-up next week.  Encouraged patient to ask nephrology for clarification regarding HCTZ.  No medication changes made today.

## 2020-03-05 ENCOUNTER — Other Ambulatory Visit: Payer: Self-pay | Admitting: Family Medicine

## 2020-03-05 LAB — BASIC METABOLIC PANEL
BUN/Creatinine Ratio: 13 (ref 9–20)
BUN: 28 mg/dL — ABNORMAL HIGH (ref 6–24)
CO2: 23 mmol/L (ref 20–29)
Calcium: 9.6 mg/dL (ref 8.7–10.2)
Chloride: 100 mmol/L (ref 96–106)
Creatinine, Ser: 2.08 mg/dL — ABNORMAL HIGH (ref 0.76–1.27)
GFR calc Af Amer: 42 mL/min/{1.73_m2} — ABNORMAL LOW (ref >59)
GFR calc non Af Amer: 36 mL/min/{1.73_m2} — ABNORMAL LOW (ref >59)
Glucose: 212 mg/dL — ABNORMAL HIGH (ref 65–99)
Potassium: 4.6 mmol/L (ref 3.5–5.2)
Sodium: 137 mmol/L (ref 134–144)

## 2020-03-05 LAB — LIPID PANEL
Chol/HDL Ratio: 7.6 ratio — ABNORMAL HIGH (ref 0.0–5.0)
Cholesterol, Total: 221 mg/dL — ABNORMAL HIGH (ref 100–199)
HDL: 29 mg/dL — ABNORMAL LOW (ref >39)
LDL Chol Calc (NIH): 138 mg/dL — ABNORMAL HIGH (ref 0–99)
Triglycerides: 294 mg/dL — ABNORMAL HIGH (ref 0–149)
VLDL Cholesterol Cal: 54 mg/dL — ABNORMAL HIGH (ref 5–40)

## 2020-03-05 MED ORDER — GABAPENTIN 300 MG PO CAPS: 300.0000 mg | ORAL_CAPSULE | Freq: Every day | ORAL | 0 refills | Status: DC

## 2020-03-10 ENCOUNTER — Other Ambulatory Visit: Payer: Self-pay | Admitting: Family Medicine

## 2020-03-10 ENCOUNTER — Ambulatory Visit: Payer: Self-pay | Admitting: Licensed Clinical Social Worker

## 2020-03-10 DIAGNOSIS — I10 Essential (primary) hypertension: Secondary | ICD-10-CM

## 2020-03-10 DIAGNOSIS — Z789 Other specified health status: Secondary | ICD-10-CM

## 2020-03-10 NOTE — Chronic Care Management (AMB) (Signed)
   Social Work Care Management  Referral Note  03/10/2020 Name: Patrick [name] MRN: [id number] DOB: [birth year]  Patrick [name] is a 49 y.o. year old male who sees Rory Percy, [place] for primary care.  LCSW was consulted by [facility] to assistance patient with [employer] . LSCW reached out to patient to assess needs.  Provided patient with phone number to Medication assistance program and reviewed process.  Also discussed the Affordable care ACT and provided phone number to explore all options.   Phone call cut short.  LCSW tried to call patient back twice to discuss and assess other need however phone rang.      Referral Priority: Routine (anticipate clinician appointment scheduling within 28 business days) Review of patient status, including review of consultants reports, relevant laboratory and other test results, as well as collaboration with appropriate care team members,  and the patient's provider was performed as part of comprehensive patient evaluation and provision of chronic care management services.    Recommendation:After briefly speaking with patient no clinical needs are identified . Patient's additional needs are best met by CCM care guides.  Patient could benefit from free resources to help file his taxes so that he can get his stimulus payment as well as any other additional needs he may identify.   Plan:  1. Patient is being referred to the Care Guide for assistance with community resources for free tax preparation and additional support.  2.   The Care Guide will contact the Care Management team if clinical needs are identified   Casimer Lanius, [place] [facility] [place]   [phone] 3:37 PM

## 2020-03-11 ENCOUNTER — Telehealth: Payer: Self-pay | Admitting: Pharmacist

## 2020-03-11 ENCOUNTER — Encounter: Payer: Self-pay | Admitting: Pharmacist

## 2020-03-11 ENCOUNTER — Other Ambulatory Visit: Payer: Self-pay | Admitting: Family Medicine

## 2020-03-11 NOTE — Patient Outreach (Signed)
[facility]) Care Management  03/11/2020  Dwight Adamczak 05-01-1971 [id number]   Called patient to discuss the patient assistance process. He spoke with [facility] Pharmacist, Haynes Hoehn, last week about help with Jardiance affordability. Unfortunately, he did not have the necessary financial documentation to apply.   His provider was asked about switching to [place] as it comes from [facility] & [facility]'s Program and they appear to do a soft credit check versus requiring actual financial paperwork.  Dr. Linwood Dibbles agreed. The change in therapy was discussed with the patient as well as letting him know an application would be mailed to his home.  Patient communicated understanding.  Plan: I will route patient assistance letter to [facility] pharmacy technician who will coordinate patient assistance program application process for medications listed above.  [facility] pharmacy technician will assist with obtaining all required documents from both patient and provider(s) and submit application(s) once completed.           Lilla Shook, CPhT will follow the patient.  Beecher Mcardle, PharmD, BCACP [facility] Clinical Pharmacist [phone]

## 2020-03-11 NOTE — Telephone Encounter (Signed)
-----   Message from Ellwood Dense, DO sent at 03/10/2020  1:39 PM EDT ----- Regarding: RE: Medication Assistance Referral Ok, let's go with [place].  Thanks!! ----- Message ----- From: Beecher Mcardle, [facility] Sent: 03/10/2020  11:08 AM EDT To: Ellwood Dense, DO Subject: RE: Medication Assistance Referral             Good morning Dr. Linwood Dibbles.  My name is Nunzio Cobbs and I am covering for Pacheco as she is out of PAL.  Unfortunately, for Invokana, the patient would have to supply income documentation. Marcelline Deist may be an option because [employer] does a soft credit check vs. Firm proof of income.  Blessings,  Beecher Mcardle, PharmD, BCACP [facility] Clinical Pharmacist [phone]  ----- Message ----- From: Ellwood Dense, DO Sent: 03/07/2020   8:20 PM EDT To: Wynonia Hazard, RPH Subject: RE: Medication Assistance Referral             Myrtie Soman,  Do you think we could get invokana instead of jardiance? If not, I am ok with dapagliflozin for diabetic kidney disease. I am also ok with getting him basaglar as he needs additional diabetic control.  Let me know your thoughts. Thanks! Jill Side  ----- Message ----- From: Wynonia Hazard, [facility] Sent: 03/06/2020  10:12 AM EDT To: Kathrin Ruddy, RPH, Soundra Pilon, LCSW, # Subject: Medication Assistance Referral                 Hi All,  I received a referral for Mr. Wildeman for assistance with Jardiance PAP.  Spoke with him this AM and he states he lost his job and has no insurance.  He will need to turn in proof of income to apply for Jardiance and this company [facility]) does not accept bank statements.    Gavin Pound, can you possibly reach out to him about setting up with Medicaid or other assistance?  I know this may take months.    -Pete, do you have samples?  He may need them for several months until paperwork to prove income is available and also during PAP processing.      -Other option is to switch to [place] made by [employer] which  does accept bank statements as proof of income and may even do their own 3rd party credit check.   Just let me know your thoughts.  Thanks! [employer]

## 2020-03-12 ENCOUNTER — Telehealth: Payer: Self-pay

## 2020-03-12 ENCOUNTER — Other Ambulatory Visit: Payer: Self-pay | Admitting: Family Medicine

## 2020-03-12 DIAGNOSIS — N1832 Chronic kidney disease, stage 3b: Secondary | ICD-10-CM

## 2020-03-12 NOTE — Progress Notes (Signed)
Called patient but had to leave VM.   We are going to recheck his kidney function prior to starting his new diabetes medication. Please help him make a lab appointment.  Thanks!

## 2020-03-12 NOTE — Telephone Encounter (Signed)
03/12/2020 Spoke with patient about the [employer] Income Tax Assistance Program. Patient stated he did not have any other needs. Olean Ree [phone]

## 2020-03-16 ENCOUNTER — Other Ambulatory Visit: Payer: Self-pay | Admitting: Pharmacy Technician

## 2020-03-16 ENCOUNTER — Other Ambulatory Visit: Payer: Self-pay

## 2020-03-16 ENCOUNTER — Other Ambulatory Visit: Payer: Self-pay | Admitting: Family Medicine

## 2020-03-16 DIAGNOSIS — N1832 Chronic kidney disease, stage 3b: Secondary | ICD-10-CM

## 2020-03-16 MED ORDER — BASAGLAR KWIKPEN 100 UNIT/ML ~~LOC~~ SOPN: 10.0000 [IU] | PEN_INJECTOR | Freq: Every day | SUBCUTANEOUS | 0 refills | Status: DC

## 2020-03-17 ENCOUNTER — Ambulatory Visit: Payer: Self-pay | Admitting: Pharmacist

## 2020-03-17 ENCOUNTER — Other Ambulatory Visit: Payer: Self-pay | Admitting: Family Medicine

## 2020-03-17 ENCOUNTER — Telehealth: Payer: Self-pay | Admitting: Family Medicine

## 2020-03-17 LAB — BASIC METABOLIC PANEL
BUN/Creatinine Ratio: 14 (ref 9–20)
BUN: 28 mg/dL — ABNORMAL HIGH (ref 6–24)
CO2: 22 mmol/L (ref 20–29)
Calcium: 9.7 mg/dL (ref 8.7–10.2)
Chloride: 102 mmol/L (ref 96–106)
Creatinine, Ser: 2.04 mg/dL — ABNORMAL HIGH (ref 0.76–1.27)
GFR calc Af Amer: 43 mL/min/{1.73_m2} — ABNORMAL LOW (ref >59)
GFR calc non Af Amer: 37 mL/min/{1.73_m2} — ABNORMAL LOW (ref >59)
Glucose: 269 mg/dL — ABNORMAL HIGH (ref 65–99)
Potassium: 4.3 mmol/L (ref 3.5–5.2)
Sodium: 138 mmol/L (ref 134–144)

## 2020-03-17 MED ORDER — TRULICITY 0.75 MG/0.5ML ~~LOC~~ SOAJ: 0.7500 mg | SUBCUTANEOUS | 0 refills | Status: DC

## 2020-03-17 NOTE — Telephone Encounter (Signed)
Informed patient of recent results. Cr still elevated, GFR still <45. Will not start SGLT2 and instead start trulicity. Pharmacy team will aid patient in establishing with PAP program. Patient is to follow up with me after initiating. All questions answered.   Also, patient recently started on labetalol for additional BP control by nephrology. Pharmacy advised patient to stop lisinopril 10mg  and to continue with lisinopril/HCTZ combination, outpatient pharmacy notified. Epic medication list updated.

## 2020-03-18 ENCOUNTER — Other Ambulatory Visit: Payer: Self-pay | Admitting: Pharmacy Technician

## 2020-03-18 NOTE — Patient Outreach (Signed)
[facility]) Care Management  03/18/2020  Patrick [name] [birth year] [id number]                                       Medication Assistance Referral  Referral From: [facility] RPh Doree Fudge  Medication/Company: [employer] / [employer] Patient application portion: Mailed Provider application portion: Faxed  to Dr. Ralph Leyden Provider address/fax verified via: Office website  Follow up:  Will follow up with patient in 10-14 business days to confirm application(s) have been received.  Suzan Slick Effie Shy, CPhT Certified Pharmacy Technician [facility]

## 2020-03-23 ENCOUNTER — Telehealth: Payer: Self-pay

## 2020-03-23 NOTE — Telephone Encounter (Signed)
03/23/20 Spoke with patient about [employer] Income Tax Assistance program.  Patient has contact information and will make an appointment to have his taxes done.  Patient does not have any other needs. Closing referral. Olean Ree [phone]

## 2020-04-02 ENCOUNTER — Ambulatory Visit (INDEPENDENT_AMBULATORY_CARE_PROVIDER_SITE_OTHER): Payer: Self-pay | Admitting: Pharmacist

## 2020-04-02 ENCOUNTER — Other Ambulatory Visit: Payer: Self-pay

## 2020-04-02 DIAGNOSIS — E119 Type 2 diabetes mellitus without complications: Secondary | ICD-10-CM

## 2020-04-02 MED ORDER — LISINOPRIL-HYDROCHLOROTHIAZIDE 20-12.5 MG PO TABS: 1.0000 | ORAL_TABLET | Freq: Every day | ORAL | 11 refills | Status: DC

## 2020-04-02 MED ORDER — INSULIN GLARGINE 100 UNIT/ML SOLOSTAR PEN: 10.0000 [IU] | PEN_INJECTOR | Freq: Every day | SUBCUTANEOUS | 0 refills | Status: DC

## 2020-04-02 NOTE — Patient Instructions (Addendum)
It was great seeing you today!  Today the plan is... 1. START Lantus 10 units daily 2. We will work on getting Trulicity from [employer] which will be $0 and shipped to your house. It may take up to 1 month to receive Trulicity in the mail. Make sure you answer any unknown phone number because drug manufacturer will contact you regarding shipping. Once you receive Trulicity you will take Trulicity 0.75 mg subcutaneously every week (make sure to administer Trulicity on the same day). Throw away entire pen after you are done. Keep unopened Trulicity in the fridge. If your blood sugars become less than 100 mg/dL then call pharmacist at [facility].  3. STOP glipizide 4. Monitor your blood sugar at least 2-3 times per week in the morning 5. Continue monitoring your blood pressure daily and following with your kidney doctor regarding your blood pressure management.

## 2020-04-02 NOTE — Progress Notes (Signed)
S:     Chief Complaint  Patient presents with  . Medication Management    DM    Patient arrives in good spirits ambulating without assistance.  Presents for diabetes evaluation, education, and management. Patient was referred and last seen by Primary Care Provider, Dr. Linwood Dibbles, on 03/04/20.  He states he has not taken his BP meds today. He reports his BP readings are typically elevated at doctor's office and are similar at home. He follows with nephrologist, Dr. Arrie Aran, at [facility] (next appt 05/2019). He states he has not noticed a difference after initiation of "fluid pill" - no resolution of edema and/or not increase in urination.  Patient reports Diabetes was diagnosed in about 10 years ago  Family/Social History: sister (DM); cousin (DM); mom (DM)  Insurance coverage/medication affordability: none   Patient reports adherence with medications.  Current diabetes medications include: metformin 1000 mg twice daily, glipizde 10 mg twice daily Current hypertension medications include: lisinopril-HCTZ 20-12.5 mg daily, amlodipine 10 mg daily, labatelol 100 mg twice daily -Patient also takes Lasix 20 mg daily Current hyperlipidemia medications include: atorvastatin 40 mg daily  Prior DM medications: Lantus 10 units daily ("helped a little - reports BG readings ~100 mg/dL"; couldn't afford it)  Patient is unsure if he has experienced hypoglycemic events, however, he states his BG has been so high he does not think he has had a low BG -Patient does not check his BG when he feels dizzy. He attributes dizziness to BP.  Patient reported dietary habits: Eats 2 meals/day Breakfast: sausage/eggs Lunch: skips Dinner: baked foods (e.g., chicken) Snacks: fruit (apples), peanut butter crackers Drinks:diet coke  Patient-reported exercise habits: denies   Patient reports 1-2 episodes of nocturia (nighttime urination) each night.  Patient reports neuropathy (nerve pain) -  "pain in left feet, however, he can still feel his feet". Patient reports visual changes. He does not follow with ophthalmology currently (last saw ophthalmologist/optometrist "a few years ago" who told him he may need glasses). Patient reports self foot exams.     O:  Physical Exam Musculoskeletal:     Right lower leg: Edema present.     Left lower leg: Edema present.     Comments: (trace RLE and LLE edema)    Review of Systems  Neurological: Positive for dizziness.     Lab Results  Component Value Date   HGBA1C 9.3 (A) 03/04/2020   Vitals:   04/02/20 1005  BP: (!) 162/100  Pulse: 89  SpO2: 99%    Lipid Panel     Component Value Date/Time   CHOL 221 (H) 03/04/2020 1113   TRIG 294 (H) 03/04/2020 1113   HDL 29 (L) 03/04/2020 1113   CHOLHDL 7.6 (H) 03/04/2020 1113   CHOLHDL 7.2 10/31/2014 1540   VLDL 72 (H) 10/31/2014 1540   LDLCALC 138 (H) 03/04/2020 1113    Patient reports he checked FBG last week and it was ~300 mg/dL   Clinical Atherosclerotic Cardiovascular Disease (ASCVD): No  The 10-year ASCVD risk score Denman George DC Jr., et al., 2013) is: 27.3%   Values used to calculate the score:     Age: 49 years     Sex: Male     Is Non-Hispanic African American: Yes     Diabetic: Yes     Tobacco smoker: No     Systolic Blood Pressure: 162 mmHg     Is BP treated: Yes     HDL Cholesterol: 29 mg/dL  Total Cholesterol: 221 mg/dL    A/P: Diabetes longstanding for the past 10 years currently uncontrolled. Patient is adherent with medication. Control is suboptimal due to issues with cost of medication, diet, lack of exercise, and lack of BG monitoring. It is important to control DM to prevent further worsening of CKD. - REStarted basal insulin Lantus (insulin glargine) 10 units daily.  Medication Samples have been provided to the patient.  Drug name: Lantus      Strength: 100 mg/dL        Qty: 2 LOT: [id number]  Exp.Date: 09/10/20  Dosing instructions: Administer Lantus  10 units daily   -Started GLP-1 Trulicity (generic name dulaglutide) 0.75 mg subQ weekly -Will obtain Trulicity and [employer] via [employer]. Once he receives [employer], he will switch from Lantus 10 units daily to Basaglar 10 units daily.  Faxed Paperwork to [facility] for support 04/03/2020. -Discontinued glipizide  -Extensively discussed pathophysiology of diabetes, recommended lifestyle interventions, dietary effects on blood sugar control -Counseled on s/sx of and management of hypoglycemia -Next A1C anticipated 05/2020.   ASCVD risk - primary prevention in patient with diabetes. Last LDL is not controlled on 03/04/20 and atorvastatin 40 mg daily was initiated afterwards. ASCVD risk score is >20%  - High intensity statin indicated.  -Continued aspirin 81 mg  -Continued atorvastatin 40 mg.   Hypertension longstanding currently uncontrolled, however, he did not take his BP medications prior to appt.  Blood pressure goal = <130/80 mmHg. Patient medication adherence suboptimal.  Blood pressure control is suboptimal due to comorbidities (DM, CKD), diet, and lack of exercise. -Continue current HTN medication (lisinopril-HCTZ 20-12.5 mg daily, amlodipine 10 mg daily, labatelol 100 mg twice daily, furosemide 20 mg daily) managed by nephrologist, Dr. Marval Regal (next appt 05/2019).  Written patient instructions provided.  Total time in face to face counseling 40 minutes.   Follow up PCP Clinic Visit and nephrologist.   Patient seen with Maryan Rued, PharmD PGY-1 Pharmacy Resident and Drexel Iha, PharmD, PGY2 Pharmacy Resident.

## 2020-04-03 ENCOUNTER — Encounter: Payer: Self-pay | Admitting: Pharmacist

## 2020-04-03 NOTE — Progress Notes (Signed)
Reviewed: I agree with Dr. Koval's documentation and management. 

## 2020-04-03 NOTE — Assessment & Plan Note (Signed)
Diabetes longstanding for the past 10 years currently uncontrolled. Patient is adherent with medication. Control is suboptimal due to issues with cost of medication, diet, lack of exercise, and lack of BG monitoring. It is important to control DM to prevent further worsening of CKD. - REStarted basal insulin Lantus (insulin glargine) 10 units daily.  Medication Samples have been provided to the patient.  Drug name: Lantus      Strength: 100 mg/dL        Qty: 2 LOT: [id number]  Exp.Date: 09/10/20  Dosing instructions: Administer Lantus 10 units daily   -Started GLP-1 Trulicity (generic name dulaglutide) 0.75 mg subQ weekly -Will obtain Trulicity and Hospital doctor via [employer]. Once he receives [employer], he will switch from Lantus 10 units daily to Basaglar 10 units daily.  Faxed Paperwork to [facility] for support 04/03/2020. -Discontinued glipizide  -Extensively discussed pathophysiology of diabetes, recommended lifestyle interventions, dietary effects on blood sugar control -Counseled on s/sx of and management of hypoglycemia -Next A1C anticipated 05/2020.

## 2020-04-06 ENCOUNTER — Other Ambulatory Visit: Payer: Self-pay | Admitting: Family Medicine

## 2020-04-09 ENCOUNTER — Other Ambulatory Visit: Payer: Self-pay | Admitting: Pharmacy Technician

## 2020-04-09 NOTE — Patient Outreach (Signed)
[facility]) 04/09/2020  Johncharles Fusselman 12/13/1970 [id number]   Received patient and provider portion(s) of patient assistance application(s) for Trulicity and Basaglar. Faxed completed application and required documents into [employer].  Will follow up with company(ies) in 10-14 business days to check status of application(s).  Suzan Slick Effie Shy, CPhT Certified Pharmacy Technician [facility]

## 2020-04-11 ENCOUNTER — Other Ambulatory Visit: Payer: Self-pay | Admitting: Family Medicine

## 2020-04-11 DIAGNOSIS — I10 Essential (primary) hypertension: Secondary | ICD-10-CM

## 2020-04-14 ENCOUNTER — Other Ambulatory Visit: Payer: Self-pay | Admitting: Family Medicine

## 2020-04-14 DIAGNOSIS — I10 Essential (primary) hypertension: Secondary | ICD-10-CM

## 2020-04-20 ENCOUNTER — Other Ambulatory Visit: Payer: Self-pay | Admitting: Family Medicine

## 2020-04-21 ENCOUNTER — Other Ambulatory Visit: Payer: Self-pay | Admitting: Family Medicine

## 2020-05-01 ENCOUNTER — Telehealth: Payer: Self-pay

## 2020-05-01 MED ORDER — BASAGLAR KWIKPEN 100 UNIT/ML ~~LOC~~ SOPN: 10.0000 [IU] | PEN_INJECTOR | Freq: Every day | SUBCUTANEOUS | Status: DC

## 2020-05-01 NOTE — Telephone Encounter (Signed)
Patient calls nurse line regarding questions regarding medication management. Patient states that he has not discussed basaglar with provider. [facility] was wanting to send him basaglar and trulicity, however, patient only accepted trulicity as he said he wanted to speak to provider before taking basaglar. Patient requesting phone call from Dr. Raymondo Band.   To Dr. Raymondo Band.

## 2020-05-01 NOTE — Telephone Encounter (Signed)
Contacted patient RE clarifying treatment plan with Trulicity (dulaglutide) and insulin.   Patient reports blood sugars are still "kind-of high" reporting most readings> 200 and some in the 300s.    Patient is currently taking Lantus (insulin glargine) 10 units daily.  I shared that Mariella Saa is also insulin glargine and instructed him to continue same dose of 10 units as he initiates the Trulicity.  He will use remaining supply of Lanuts (a few days) then continue with Basaglar.   We discussed if blood sugars decrease to low 100s he can stop his insulin completely.  We also discussed that Trulicity can cause GI side effects.   Follow-up by phone planned for 10-14 days to assess control.

## 2020-05-06 ENCOUNTER — Other Ambulatory Visit: Payer: Self-pay | Admitting: Family Medicine

## 2020-05-15 ENCOUNTER — Telehealth: Payer: Self-pay | Admitting: Pharmacist

## 2020-05-16 NOTE — Telephone Encounter (Signed)
Contacted patient in follow-up of blood glucose control.   Patient reports improved blood glucose control however he admits/knows that his diet is leading to numbers that hare higher than goal.  He reports readings in the high 100s.   He has been taking Basaglar, Metformin and trulicity as prescribed.  He has stopped glipizide as instructed.   Following some discussion of diet and potential medication changes in the future, we agreed that a visit with his PCP, Dr. Linwood Dibbles during June would be ideal.  He plans to call and schedule.  At that time, I suggest a tiration of Trulicity to 1.5mg  once weekly dose.   I will plan to follow-up in ~ 6 weeks to reevaluate blood glucose control.

## 2020-05-18 NOTE — Telephone Encounter (Signed)
Noted and agree. 

## 2020-06-01 ENCOUNTER — Other Ambulatory Visit: Payer: Self-pay | Admitting: Family Medicine

## 2020-06-02 ENCOUNTER — Other Ambulatory Visit: Payer: Self-pay | Admitting: Pharmacy Technician

## 2020-06-02 NOTE — Patient Outreach (Signed)
[facility])  06/02/2020  Patrick [name] 1971-01-12 [id number]   Per encounters, patient has been approved for Basaglar and Trulicity thru [employer] and has received medications.  Will remove myself from care team  Suzan Slick. Effie Shy, CPhT Certified Pharmacy Technician [facility]

## 2020-06-11 ENCOUNTER — Other Ambulatory Visit: Payer: Self-pay | Admitting: Family Medicine

## 2020-06-11 DIAGNOSIS — I10 Essential (primary) hypertension: Secondary | ICD-10-CM

## 2020-06-18 ENCOUNTER — Telehealth: Payer: Self-pay | Admitting: Pharmacist

## 2020-06-18 DIAGNOSIS — E119 Type 2 diabetes mellitus without complications: Secondary | ICD-10-CM

## 2020-06-18 MED ORDER — BASAGLAR KWIKPEN 100 UNIT/ML ~~LOC~~ SOPN: 15.0000 [IU] | PEN_INJECTOR | Freq: Every day | SUBCUTANEOUS | Status: DC

## 2020-06-18 NOTE — Telephone Encounter (Signed)
Contacted patient to evaluate blood glucose control.  Patient reports blood sugar readings in the low 200s with some readings in the 100s when he misses a meal or is less hungry.   Regimen is currently: Metformin 1000mg BID,  Trulicity 0.75mg once weekly (Tuesday) and Basaglar 10 units once daily.   He reports ~ 9 more pens of the current Trulicity dose.  He also reports some progress on healthier eating including less fried foods, less bread and more health (baked) foods.   Following some discussion we agreed to increase Basaglar from 10 to 15 units once daily.  I asked him to schedule follow-up with new PCP, Dr. Brimage.  I will plan to follow-up by phone in 4-5 weeks.    Anticipate increasing Trulicity dose from 0.75mg to 1.5mg at next contact (visit or phone call)  He will need new [facility] prescription at that time.  

## 2020-06-18 NOTE — Telephone Encounter (Signed)
Noted and agree. 

## 2020-06-18 NOTE — Assessment & Plan Note (Signed)
Contacted patient to evaluate blood glucose control.  Patient reports blood sugar readings in the low 200s with some readings in the 100s when he misses a meal or is less hungry.   Regimen is currently: Metformin 1000mg  BID,  Trulicity 0.75mg  once weekly (Tuesday) and Basaglar 10 units once daily.   He reports ~ 9 more pens of the current Trulicity dose.  He also reports some progress on healthier eating including less fried foods, less bread and more health (baked) foods.   Following some discussion we agreed to increase Basaglar from 10 to 15 units once daily.  I asked him to schedule follow-up with new PCP, Dr. 02-20-1993.  I will plan to follow-up by phone in 4-5 weeks.    Anticipate increasing Trulicity dose from 0.75mg  to 1.5mg  at next contact (visit or phone call)  He will need new l[facility] cares prescription at that time.

## 2020-06-27 ENCOUNTER — Other Ambulatory Visit: Payer: Self-pay | Admitting: Family Medicine

## 2020-07-16 ENCOUNTER — Telehealth: Payer: Self-pay | Admitting: Pharmacist

## 2020-07-16 MED ORDER — TRULICITY 1.5 MG/0.5ML ~~LOC~~ SOAJ
1.5000 mg | SUBCUTANEOUS | 11 refills | Status: DC
Start: 2020-07-16 — End: 2022-06-23

## 2020-07-16 NOTE — Telephone Encounter (Signed)
Contacted patient to evaluate blood glucose control. Patient reports he has not checked his blood sugars this past week due to running out of test strips, however plans to pick up test strips from pharmacy today. Reports the last time he checked his sugars, they were in the 100s in the morning (fasting) and 200s in the afternoon. Denies hypoglycemia. Reports adherence with current DM medications and denies side effects.   Regimen is currently: Metformin 1000mg  BID,  Trulicity 0.75mg  once weekly (Tuesday) and Basaglar 15 units once daily.   Following some discussion we agreed to increase Trulicity to 1.5 mg once weekly. Will apply for [employer] patient assistance.   Follow-up appointment scheduled for August 19th at 8:30AM.

## 2020-07-24 ENCOUNTER — Other Ambulatory Visit: Payer: Self-pay

## 2020-07-24 MED ORDER — BASAGLAR KWIKPEN 100 UNIT/ML ~~LOC~~ SOPN: 15.0000 [IU] | PEN_INJECTOR | Freq: Every day | SUBCUTANEOUS | Status: DC

## 2020-07-24 NOTE — Telephone Encounter (Signed)
Patient calls nurse line regarding receiving refill on basaglar insulin. Patient states that one of his pens had a malfunction and with increase from 10-15 units he needs additional refill.   Patient states that [facility]   Patient scheduled for follow up with Dr. Raymondo Band on 8/19  Forwarding to PCP and Dr. Weston Settle, RN

## 2020-07-27 NOTE — Telephone Encounter (Signed)
Samples given to patient  

## 2020-07-27 NOTE — Telephone Encounter (Signed)
Contacted patient RE supply of insulin glargine.  He states that one of the basaglar pens was dysfunctional.   He also noted that his dose had been increased which has lead to him using his supply faster.   I agreed to supply an additional sample pen of insulin glargine (Lantus).  He was willing to come to the office to pick up.   Medication Samples have been provided for the patient (located in the refrigerator).  Drug name: Lantus(insulin glargine)       Strength: 100units/ml        Qty: 1 pen  LOT: [id number]  Exp.Date: 09/10/2021  Dosing instructions: 15 units once daily.   The patient has been instructed regarding the correct time, dose, and frequency of taking this medication, including desired effects and most common side effects.   Madelon Lips 11:26 AM 07/27/2020  Patient plans to keep Rx clinic appointment on Thursday.  More discussion and potential adjustment in tx at that time.

## 2020-07-29 ENCOUNTER — Other Ambulatory Visit: Payer: Self-pay | Admitting: Family Medicine

## 2020-07-30 ENCOUNTER — Ambulatory Visit: Payer: Self-pay | Admitting: Pharmacist

## 2020-08-03 ENCOUNTER — Encounter: Payer: Self-pay | Admitting: Pharmacist

## 2020-08-03 ENCOUNTER — Other Ambulatory Visit: Payer: Self-pay

## 2020-08-03 ENCOUNTER — Ambulatory Visit (INDEPENDENT_AMBULATORY_CARE_PROVIDER_SITE_OTHER): Payer: Self-pay | Admitting: Pharmacist

## 2020-08-03 VITALS — HR 86 | Ht 69.0 in | Wt 253.8 lb

## 2020-08-03 DIAGNOSIS — E119 Type 2 diabetes mellitus without complications: Secondary | ICD-10-CM

## 2020-08-03 DIAGNOSIS — N183 Chronic kidney disease, stage 3 unspecified: Secondary | ICD-10-CM

## 2020-08-03 DIAGNOSIS — E7800 Pure hypercholesterolemia, unspecified: Secondary | ICD-10-CM

## 2020-08-03 DIAGNOSIS — R600 Localized edema: Secondary | ICD-10-CM

## 2020-08-03 LAB — POCT GLYCOSYLATED HEMOGLOBIN (HGB A1C): HbA1c, POC (controlled diabetic range): 6.9 % (ref 0.0–7.0)

## 2020-08-03 MED ORDER — POTASSIUM CHLORIDE CRYS ER 20 MEQ PO TBCR: 20.0000 meq | EXTENDED_RELEASE_TABLET | Freq: Every day | ORAL | 0 refills | Status: DC

## 2020-08-03 NOTE — Assessment & Plan Note (Signed)
Edema Chronic in patient with CKD and also history of wearing support stockings.  Swelling 2+ bilaterally despite furosemide and HCTZ combination therapy.  Deferred to Dr. Rachael Darby (new PCP) who increase furosemide to 40mg  daily for 4 days as well as initiated  Potassium daily for same 4 days.  New prescription sent.

## 2020-08-03 NOTE — Progress Notes (Signed)
FPTS Interim Progress Note  Alerted by Dr. Raymondo Band that the patient had increased LE edema.  Patient denies chest pain, dyspnea on exertion or shortness of breath. States that he takes 20 mg Lasix daily.    O: Pulse 86    Ht 5\' 9"  (1.753 m)    Wt 253 lb 12.8 oz (115.1 kg)    SpO2 98%    BMI 37.48 kg/m    GEN: well appearing male, in no acute distress  CV: regular rate and rhythm, no murmurs appreciated, no JVP RESP: no increased work of breathing, clear to ascultation bilaterally  MSK: 2+ bilateral lower extremity pitting edema, no calf tenderness  SKIN: warm, dry     A/P: Bilateral lower extremity pitting edema.  VSS. Takes 20 mg Lasix daily.  He also takes amlodipine which likely is increasing his BLE.  - Advised patient to take 40 mg of Lasix for the next 4 days with 20 mEq of Potassium then return to 20 mg thereafter.  - Patient to call if no response to 40 mg Lasix; will increase if needed  - Follow up with me (his PCP) ASAP.    , DO 08/03/2020, 11:27 AM PGY-2, [facility] pager [phone]

## 2020-08-03 NOTE — Progress Notes (Signed)
S:     Chief Complaint  Patient presents with  . Medication Management    diabetes    Patient arrives in good spirits, ambulating without assistance.  Presents for diabetes evaluation, education, and management Patient was referred and last seen by Primary Care Provider, Dr. Linwood Dibbles on 03/04/2020 and last Rx Clinic visit 04/02/2020. Patient reports that he has not received new supply of Trulicity, thus has not increased dose yet. Patient is overall without complaints today, although does report occasional dizziness upon standing.  Patient reports Diabetes was diagnosed in 2008.    Insurance coverage/medication affordability: Uninsured. Receiving Trulicity and [employer])  Medication adherence reported adherent .   Current diabetes medications include: Trulicity, metformin 1000 mg BID,  Current hypertension medications include: lisinopril-HCTZ 20-25 mg daily, amlodipine 10 mg daily, labetolol 100 mg QHS,  Current hyperlipidemia medications include: atorvastatin 40 mg daily  Patient denies hypoglycemic events.  Patient reported dietary habits: Eats 2 meals/day Breakfast: eggs and sausage Lunch:rarely eats lunch   Dinner: chicken  Snacks: denies eating snacks Drinks: "mostly sodas"; does not like taste of water  Patient-reported exercise habits: minimal    Patient reports (gabapentin helps control nerve pain).  Denies hypoglycemic symptoms or events. (reports lowest BG value of 100)   A1c 6.9 (08/03/2020)   O:  Physical Exam Vitals reviewed.  Constitutional:      Appearance: He is obese.  Cardiovascular:     Rate and Rhythm: Normal rate and regular rhythm.  Musculoskeletal:     Right lower leg: Edema present.     Left lower leg: Edema present.     Comments: 2+ bilateral LEE   Psychiatric:        Mood and Affect: Mood normal.        Behavior: Behavior normal.        Thought Content: Thought content normal.        Judgment: Judgment normal.        Review of Systems  All other systems reviewed and are negative.  A1c 08/03/2020 (today's visit): 6.9   Lab Results  Component Value Date   HGBA1C 9.3 (A) 03/04/2020   There were no vitals filed for this visit.  Lipid Panel     Component Value Date/Time   CHOL 221 (H) 03/04/2020 1113   TRIG 294 (H) 03/04/2020 1113   HDL 29 (L) 03/04/2020 1113   CHOLHDL 7.6 (H) 03/04/2020 1113   CHOLHDL 7.2 10/31/2014 1540   VLDL 72 (H) 10/31/2014 1540   LDLCALC 138 (H) 03/04/2020 1113    Home fasting blood sugars: 100 readings exclusively  Rarely seeing 200 readings 2 hour post-meal/random blood sugars: minimal checks   Clinical Atherosclerotic Cardiovascular Disease (ASCVD): No  The 10-year ASCVD risk score Denman George DC Jr., et al., 2013) is: 28.4%   Values used to calculate the score:     Age: 49 years     Sex: Male     Is Non-Hispanic African American: Yes     Diabetic: Yes     Tobacco smoker: No     Systolic Blood Pressure: 162 mmHg     Is BP treated: Yes     HDL Cholesterol: 29 mg/dL     Total Cholesterol: 221 mg/dL    A/P: Diabetes longstanding currently improved and now controlled with A1c goal <7.0. Medication adherence appears optimal. Patient has shown large improvement in A1c after starting GLP-1. Patient's BMI is elevated (<30) and overall unchanged. Continued Trulicity 1.5 mg  once weekly, starting as soon as supply is delivered. Plan to continue titrating Trulicity to assist in weight loss. May discontinue Basaglar once Trulicity 1.5 mg is started. Recommended starting exercise 30 minutes 3x week to assist in weight loss. Patient denies hypogylcemic events. Reported episodes of dizziness likely secondary dehydration and orthostasis. Recommended patient to drink plenty of water throughout the day, and utilize sugar free crystal light packets to improve taste. -Continued basal insulin, Basaglar 15 units daily (insulin glargine) until starting Trulicity 1.5 mg. At that time,  discontinue Basaglar.  -Continued GLP-1 Trulicity (dulaglutide) at 0.75mg  once weekly until new dose arrives then increase to 1.5 mg once weekly.  -Extensively discussed pathophysiology of diabetes, recommended lifestyle interventions, dietary effects on blood sugar control. Recommended 30 minutes of exercise three times/week (walking). Counseled on increased water consumption. Suggested incorporating sugar free Crystal light packets to help with taste of water.  -Counseled on s/sx of and management of hypoglycemia -Next A1C anticipated 07/2021.    ASCVD risk - primary prevention in patient with diabetes. Last LDL is not controlled. ASCVD risk score is >20%  - high intensity statin indicated. Aspirin is indicated.  -Continued aspirin 81 mg  -Continued atorvastatin 40 mg.  Ordered: direct LDL 8/23 - fasting  LDL - returned at 47  Edema Chronic in patient with CKD and also history of wearing support stockings.  Swelling 2+ bilaterally despite furosemide and HCTZ combination therapy.  Deferred to Dr. Rachael Darby (new PCP) who increase furosemide to 40mg  daily for 4 days as well as initiated  Potassium daily for same 4 days.  New prescription sent.    Patient to follow-up with Dr. as soon as possible.  Consider change to Carvedilol 25mg  BID at that time or next Rx clinic visit.    Written patient instructions provided.  Total time in face to face counseling 35 minutes.   Follow up PCP Clinic Visit as soon as possible. Follow-up by phone with Pharmacist in 3-4 weeks to assess control with higher dose of Trulicity and discontinuation of insulin (15 units). Patient seen with Rachael Darby, PharmD Candidate

## 2020-08-03 NOTE — Assessment & Plan Note (Addendum)
ASCVD risk - primary prevention in patient with diabetes. Last LDL is not controlled. ASCVD risk score is >20%  - high intensity statin indicated. Aspirin is indicated.  -Continued aspirin 81 mg  -Continued atorvastatin 40 mg.  Ordered: direct LDL 8/23 - fasting   LDL = 47mg /dl - at goal - no change in statin therapy

## 2020-08-03 NOTE — Progress Notes (Signed)
Reviewed: I agree with Dr. Koval's documentation and management. 

## 2020-08-03 NOTE — Patient Instructions (Addendum)
Nice to see you today! Congratulations on an improved A1c (6.9)! Continue metformin 1000 mg twice daily with a meal. Continue checking your blood sugar three times daily. Drink plenty of water with crystal light packets throughout the day. Please increase Trulicity to 1.5 mg when your supply arrives. Once you start the higher dose of Trulicity (1.5 mg), you can stop your insulin, which is Basaglar 15 units once daily.   Increase your furosemide to 40 mg for 4 days. On Saturday, return to 1 tablet once daily. Please call if the 40 mg is not working. If on Tuesday night, you are not peeing a lot, call the office.  Follow up as soon as possible with Dr. Rachael Darby.   Pick up potassium from your pharmacy. Take Potassium for 4 days.    We will call you to check on you after stopping your insulin. Please call us if you have any questions.   Dr. Macky Lower direct line- [phone]

## 2020-08-03 NOTE — Assessment & Plan Note (Signed)
Diabetes longstanding currently improved and now controlled with A1c goal <7.0. Medication adherence appears optimal. Patient has shown large improvement in A1c after starting GLP-1. Patient's BMI is elevated (<30) and overall unchanged. Continued Trulicity 1.5 mg once weekly, starting as soon as supply is delivered. Plan to continue titrating Trulicity to assist in weight loss. May discontinue Basaglar once Trulicity 1.5 mg is started. Recommended starting exercise 30 minutes 3x week to assist in weight loss. Patient denies hypogylcemic events. Reported episodes of dizziness likely secondary dehydration and orthostasis. Recommended patient to drink plenty of water throughout the day, and utilize sugar free crystal light packets to improve taste. -Continued basal insulin, Basaglar 15 units daily (insulin glargine) until starting Trulicity 1.5 mg. At that time, discontinue Basaglar.  -Continued GLP-1 Trulicity (dulaglutide) at 0.75mg  once weekly until new dose arrives then increase to 1.5 mg once weekly.  -Extensively discussed pathophysiology of diabetes, recommended lifestyle interventions, dietary effects on blood sugar control. Recommended 30 minutes of exercise three times/week (walking). Counseled on increased water consumption. Suggested incorporating sugar free Crystal light packets to help with taste of water.  -Counseled on s/sx of and management of hypoglycemia -Next A1C anticipated 07/2021.

## 2020-08-04 ENCOUNTER — Telehealth: Payer: Self-pay

## 2020-08-04 LAB — LDL CHOLESTEROL, DIRECT: LDL Direct: 47 mg/dL (ref 0–99)

## 2020-08-04 NOTE — Progress Notes (Signed)
Reviewed: I agree with Dr. Koval's documentation and management. 

## 2020-08-04 NOTE — Telephone Encounter (Signed)
-----   Message from Kathrin Ruddy, RPH-CPP sent at 08/03/2020 12:18 PM EDT ----- Tresa Endo,  Can you make sure he is covered with Basaglar and Trulicity 1.5 weekly through [facility].  He is doing great with A1c at 6.9. Thanks for your help.

## 2020-08-04 NOTE — Telephone Encounter (Signed)
Per [employer], patient is actively enrolled in the program thru 04/28/21.  The Trulicity 1.5mg  and Mariella Saa are scheduled to be delivered to the patients home address on 08/11/20.

## 2020-08-21 ENCOUNTER — Other Ambulatory Visit: Payer: Self-pay | Admitting: Family Medicine

## 2020-08-21 DIAGNOSIS — I10 Essential (primary) hypertension: Secondary | ICD-10-CM

## 2020-08-31 ENCOUNTER — Other Ambulatory Visit: Payer: Self-pay | Admitting: Family Medicine

## 2020-08-31 ENCOUNTER — Telehealth: Payer: Self-pay | Admitting: Pharmacist

## 2020-08-31 NOTE — Telephone Encounter (Signed)
Contacted Patrick [name] RE Diabetes control with Trulicity 1.5mg  weekly dose.  Patrick [name] reports excellent sugar control and denies any use of Lantus since escalating to higher dose of Trulicity.   Denies hypoglycemia, reports only a few readings at 2 hr post-prandial when he consumes something "he shouldn't".  Denies any GI intolerance.  Overall control appears excellent.  No adjustments at this time.  D/C lantus from med list.   I am happy to work with him again as needed/requested.    Patrick [name] plans to talk with PCP, Dr. Rachael Darby later today to discuss neuropathic pain issues and hand cramping.

## 2020-08-31 NOTE — Telephone Encounter (Signed)
-----   Message from Kathrin Ruddy, RPH-CPP sent at 08/03/2020 12:14 PM EDT ----- Regarding: DM follow-up Trulicity 1.5 and insulin d/c

## 2020-08-31 NOTE — Telephone Encounter (Signed)
Noted and agree. 

## 2020-08-31 NOTE — Telephone Encounter (Signed)
-----   Message from Aidric Endicott G Joden Bonsall, RPH-CPP sent at 08/03/2020 12:14 PM EDT ----- °Regarding: DM follow-up Trulicity 1.5 and insulin d/c ° ° °

## 2020-09-07 ENCOUNTER — Other Ambulatory Visit: Payer: Self-pay | Admitting: Family Medicine

## 2020-09-07 DIAGNOSIS — I10 Essential (primary) hypertension: Secondary | ICD-10-CM

## 2020-09-26 IMAGING — CT CT ANGIO CHEST
2 of 7 series · 19 of 46 positions shown · IV contrast (ISOVUE)
Comparison: Radiographs of same day.

CLINICAL DATA: Chest pain.

EXAM:
CT ANGIOGRAPHY CHEST WITH CONTRAST
TECHNIQUE: Multidetector CT imaging of the chest was performed using the
standard protocol during bolus administration of intravenous
contrast. Multiplanar CT image reconstructions and MIPs were
obtained to evaluate the vascular anatomy.
CONTRAST:  100mL SUBH9I-IPX IOPAMIDOL (SUBH9I-IPX) INJECTION 76%

[Series 5: thins · axial · 0.80mm/px · z∈[-293,-27]mm · 16 of 302 slices shown]
[im 18/302  lung]
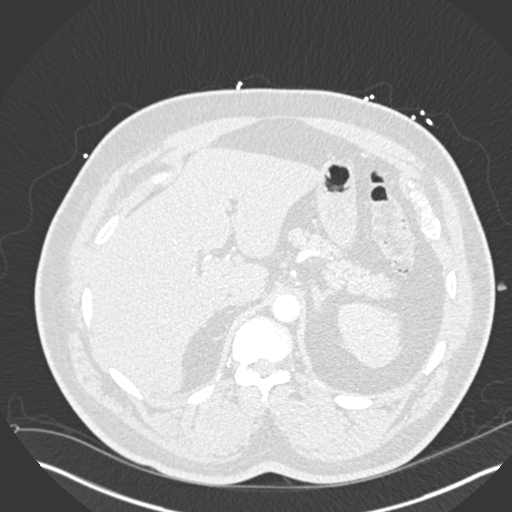
[im 36/302  soft-tissue]
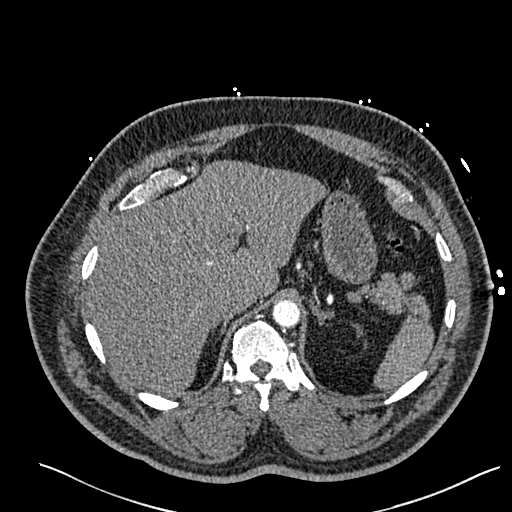
[im 54/302  lung]
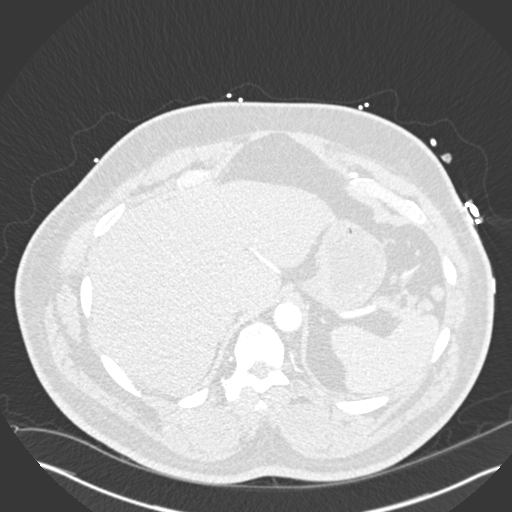
[im 71/302  soft-tissue]
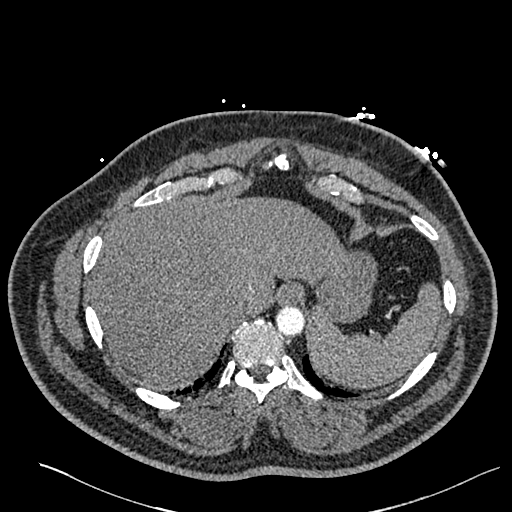
[im 89/302  lung]
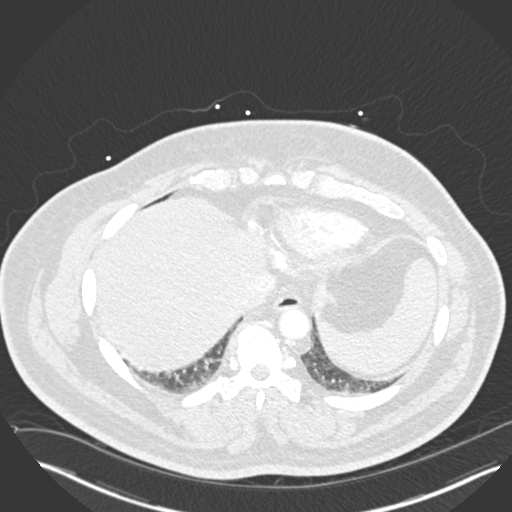
[im 107/302  soft-tissue]
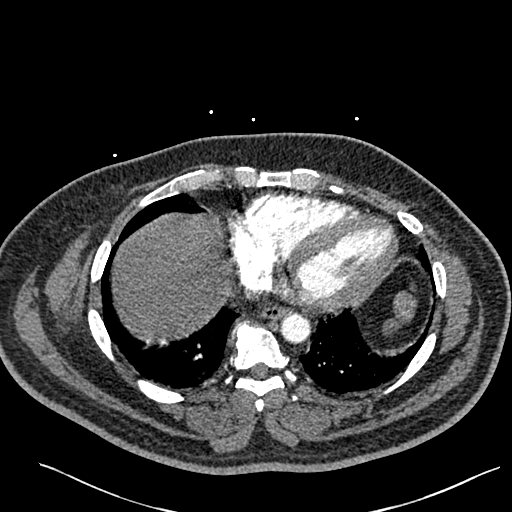
[im 124/302  lung]
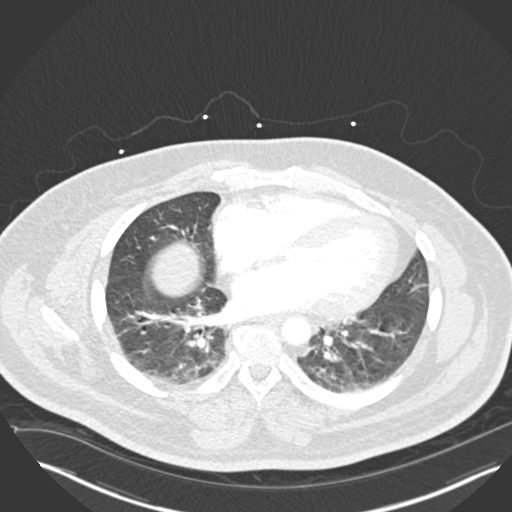
[im 142/302  soft-tissue]
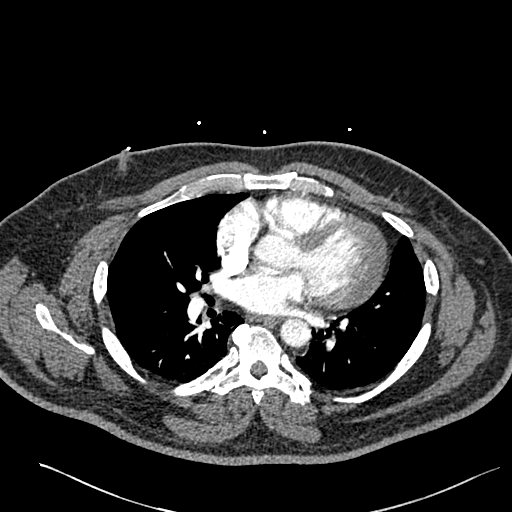
[im 160/302  lung]
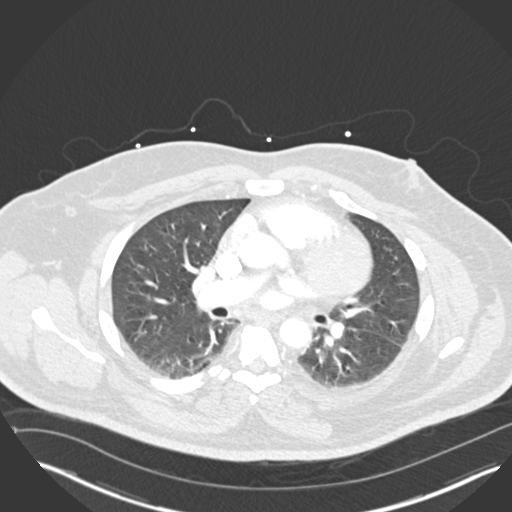
[im 178/302  soft-tissue]
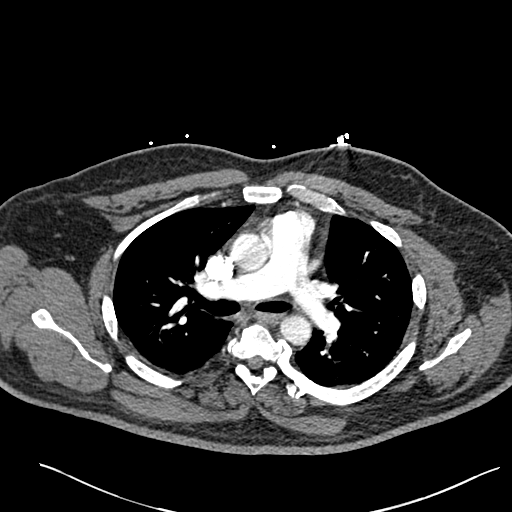
[im 195/302  lung]
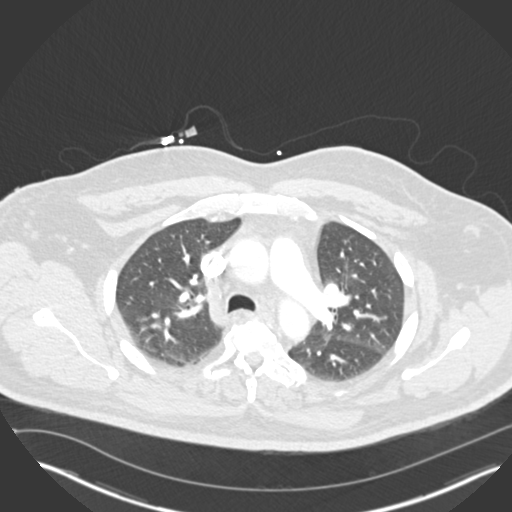
[im 213/302  soft-tissue]
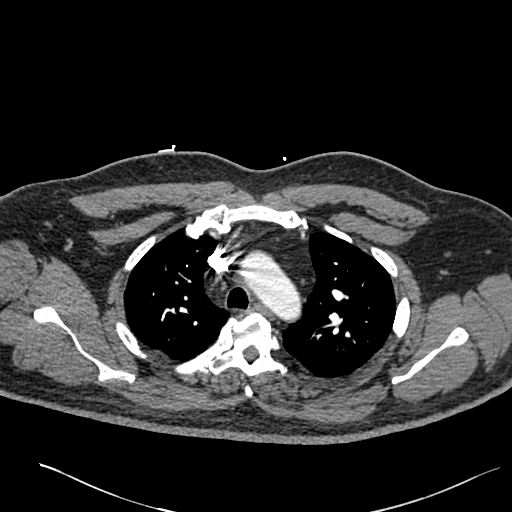
[im 231/302  lung]
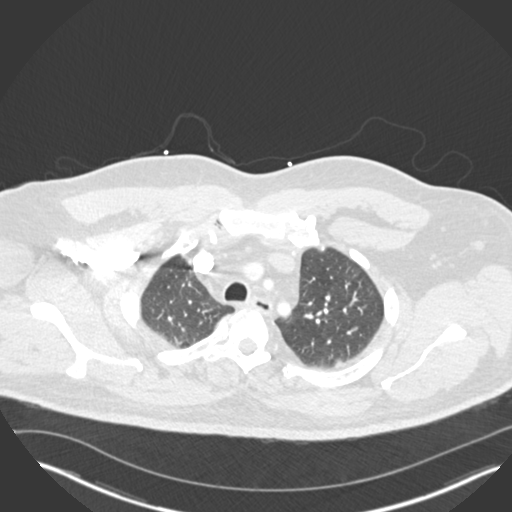
[im 248/302  soft-tissue]
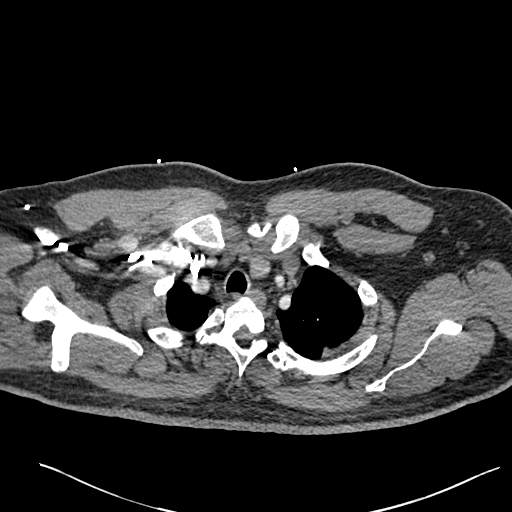
[im 266/302  lung]
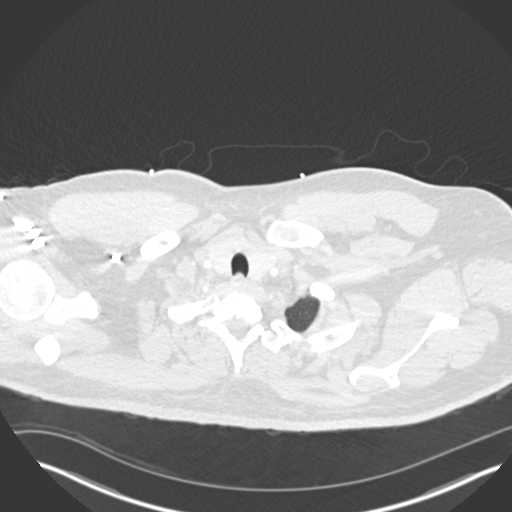
[im 284/302  soft-tissue]
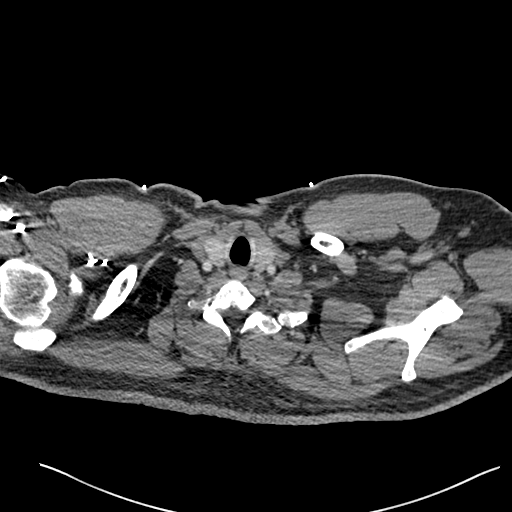

[Series 7: coronal mpr · coronal · 0.59mm/px · 3 of 170 slices shown]
[im 43/170  soft-tissue]
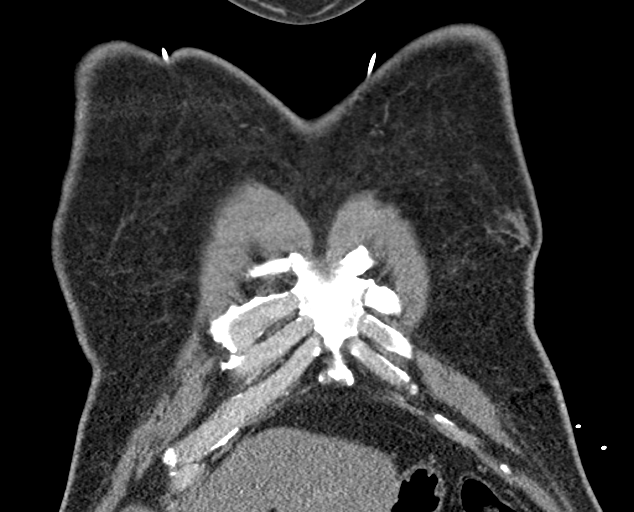
[im 85/170  soft-tissue]
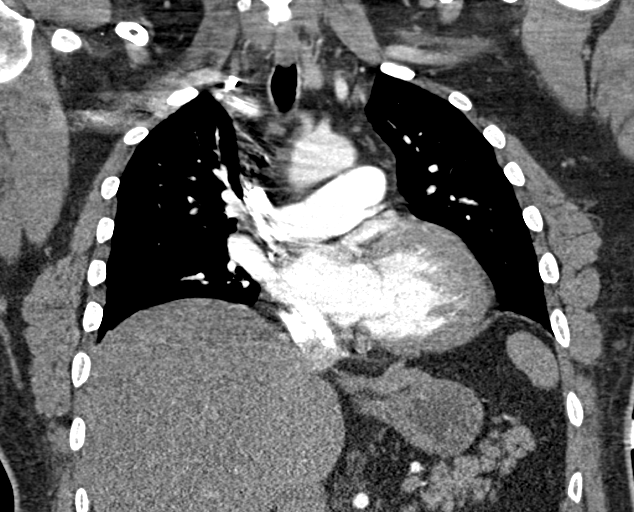
[im 127/170  soft-tissue]
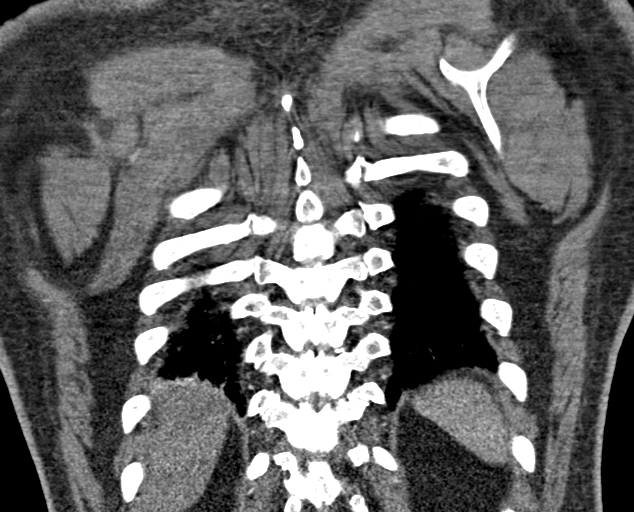

[19 of 46 positions shown; findings below may reference images not displayed]

FINDINGS: Cardiovascular: Satisfactory opacification of the pulmonary arteries
to the segmental level. No evidence of pulmonary embolism. Normal
heart size. No pericardial effusion.

Mediastinum/Nodes: No enlarged mediastinal, hilar, or axillary lymph
nodes. Thyroid gland, trachea, and esophagus demonstrate no
significant findings.

Lungs/Pleura: Lungs are clear. No pleural effusion or pneumothorax.

Upper Abdomen: No acute abnormality.

Musculoskeletal: No chest wall abnormality. No acute or significant
osseous findings.

Review of the MIP images confirms the above findings.
IMPRESSION: No definite evidence of pulmonary embolus. No acute cardiopulmonary
abnormality seen.

## 2020-10-07 ENCOUNTER — Other Ambulatory Visit: Payer: Self-pay | Admitting: Family Medicine

## 2020-11-19 ENCOUNTER — Other Ambulatory Visit: Payer: Self-pay | Admitting: Family Medicine

## 2020-11-20 ENCOUNTER — Other Ambulatory Visit: Payer: Self-pay | Admitting: Family Medicine

## 2020-11-21 ENCOUNTER — Other Ambulatory Visit: Payer: Self-pay | Admitting: Family Medicine

## 2020-11-26 ENCOUNTER — Other Ambulatory Visit: Payer: Self-pay | Admitting: Family Medicine

## 2020-11-26 DIAGNOSIS — I10 Essential (primary) hypertension: Secondary | ICD-10-CM

## 2020-11-28 ENCOUNTER — Other Ambulatory Visit: Payer: Self-pay | Admitting: Family Medicine

## 2020-11-28 DIAGNOSIS — I10 Essential (primary) hypertension: Secondary | ICD-10-CM

## 2020-12-02 ENCOUNTER — Other Ambulatory Visit: Payer: Self-pay | Admitting: Family Medicine

## 2020-12-02 DIAGNOSIS — I10 Essential (primary) hypertension: Secondary | ICD-10-CM

## 2020-12-04 ENCOUNTER — Other Ambulatory Visit: Payer: Self-pay | Admitting: Family Medicine

## 2020-12-04 DIAGNOSIS — I10 Essential (primary) hypertension: Secondary | ICD-10-CM

## 2020-12-22 ENCOUNTER — Other Ambulatory Visit: Payer: Self-pay | Admitting: Family Medicine

## 2020-12-24 ENCOUNTER — Other Ambulatory Visit: Payer: Self-pay | Admitting: Family Medicine

## 2021-01-29 ENCOUNTER — Other Ambulatory Visit: Payer: Self-pay | Admitting: Family Medicine

## 2021-03-11 ENCOUNTER — Telehealth: Payer: Self-pay

## 2021-03-11 NOTE — Telephone Encounter (Signed)
Left name & call back number with male, regarding re-enrollment for patient assistance. [phone] direct line)

## 2021-03-22 ENCOUNTER — Other Ambulatory Visit: Payer: Self-pay | Admitting: Family Medicine

## 2021-03-23 NOTE — Telephone Encounter (Signed)
Spoke to patient about signing [employer] paperwork for re-enrollment with the company. Pt states he can come by sometime soon to sign. I instructed him to tell the front desk that [facility] left paperwork for him to sign.  Application will be located in my box, highlighted and ready for him to sign.  Gave him my direct line for any questions: [phone]

## 2021-03-24 ENCOUNTER — Other Ambulatory Visit: Payer: Self-pay | Admitting: Family Medicine

## 2021-04-09 ENCOUNTER — Telehealth: Payer: Self-pay | Admitting: Pharmacist

## 2021-04-09 DIAGNOSIS — E119 Type 2 diabetes mellitus without complications: Secondary | ICD-10-CM

## 2021-04-09 NOTE — Telephone Encounter (Signed)
Spoke with patient via telephone to clarify current medications. Patient indicated that he is no longer taking insulin (stopped ~ 1 week ago) and the drug manufacturer is no longer sending it to him. Patient also indicated his nephrologist has stopped metformin due to his renal function. Currently, the only medication he is taking for his diabetes is Trulicity 1.5 MG once weekly which he endorsed compliance with. No complaints of increased thirst, urination, or other hyperglycemic symptoms. Patient did let me know he felt dizzy yesterday, but has since resolved. He has not been checking his blood sugars regularly since stopping insulin. Patient plans to contact [facility] for a follow up with provider to discuss next step for diabetes management. There is potential to increase to Trulicity 3 MG once weekly if patient continues to tolerate and requires more A1c lowering.   Current DM Medications:  Trulicity 1.5 MG once weekly   Most Recent A1c: 6.9% (08/03/20)  Pertinent Labs: Scr 2.04, eGFR 43 (03/16/20)

## 2021-04-09 NOTE — Assessment & Plan Note (Signed)
Spoke with patient via telephone to clarify current medications. Patient indicated that he is no longer taking insulin (stopped ~ 1 week ago) and the drug manufacturer is no longer sending it to him. Patient also indicated his nephrologist has stopped metformin due to his renal function. Currently, the only medication he is taking for his diabetes is Trulicity 1.5 MG once weekly which he endorsed compliance with. No complaints of increased thirst, urination, or other hyperglycemic symptoms. Patient did let me know he felt dizzy yesterday, but has since resolved. He has not been checking his blood sugars regularly since stopping insulin. Patient plans to contact [facility] for a follow up with provider to discuss next step for diabetes management. There is potential to increase to Trulicity 3 MG once weekly if patient continues to tolerate and requires more A1c lowering.

## 2021-04-30 ENCOUNTER — Other Ambulatory Visit: Payer: Self-pay | Admitting: Family Medicine

## 2021-05-02 ENCOUNTER — Other Ambulatory Visit: Payer: Self-pay | Admitting: Family Medicine

## 2021-05-28 ENCOUNTER — Other Ambulatory Visit: Payer: Self-pay | Admitting: Family Medicine

## 2021-05-28 DIAGNOSIS — I10 Essential (primary) hypertension: Secondary | ICD-10-CM

## 2021-07-01 ENCOUNTER — Other Ambulatory Visit: Payer: Self-pay | Admitting: Family Medicine

## 2021-08-02 ENCOUNTER — Other Ambulatory Visit: Payer: Self-pay | Admitting: Family Medicine

## 2021-08-03 ENCOUNTER — Other Ambulatory Visit: Payer: Self-pay | Admitting: Family Medicine

## 2021-08-30 ENCOUNTER — Other Ambulatory Visit: Payer: Self-pay | Admitting: Family Medicine

## 2021-08-30 DIAGNOSIS — I10 Essential (primary) hypertension: Secondary | ICD-10-CM

## 2021-11-01 ENCOUNTER — Ambulatory Visit: Payer: Self-pay | Admitting: Family Medicine

## 2021-11-17 ENCOUNTER — Encounter: Payer: Self-pay | Admitting: Family Medicine

## 2021-11-17 ENCOUNTER — Other Ambulatory Visit: Payer: Self-pay

## 2021-11-17 ENCOUNTER — Ambulatory Visit (INDEPENDENT_AMBULATORY_CARE_PROVIDER_SITE_OTHER): Payer: Self-pay | Admitting: Family Medicine

## 2021-11-17 VITALS — BP 120/70 | HR 71 | Ht 69.0 in | Wt 249.0 lb

## 2021-11-17 DIAGNOSIS — Z5989 Other problems related to housing and economic circumstances: Secondary | ICD-10-CM

## 2021-11-17 DIAGNOSIS — E119 Type 2 diabetes mellitus without complications: Secondary | ICD-10-CM

## 2021-11-17 DIAGNOSIS — M6530 Trigger finger, unspecified finger: Secondary | ICD-10-CM

## 2021-11-17 DIAGNOSIS — E118 Type 2 diabetes mellitus with unspecified complications: Secondary | ICD-10-CM

## 2021-11-17 DIAGNOSIS — I10 Essential (primary) hypertension: Secondary | ICD-10-CM

## 2021-11-17 DIAGNOSIS — E7800 Pure hypercholesterolemia, unspecified: Secondary | ICD-10-CM

## 2021-11-17 DIAGNOSIS — N1832 Chronic kidney disease, stage 3b: Secondary | ICD-10-CM

## 2021-11-17 LAB — POCT GLYCOSYLATED HEMOGLOBIN (HGB A1C): HbA1c, POC (controlled diabetic range): 7.1 % — AB (ref 0.0–7.0)

## 2021-11-17 MED ORDER — DAPAGLIFLOZIN PROPANEDIOL 10 MG PO TABS: 10.0000 mg | ORAL_TABLET | Freq: Every day | ORAL | 3 refills | Status: DC

## 2021-11-17 MED ORDER — TRULICITY 1.5 MG/0.5ML ~~LOC~~ SOAJ: 1.5000 mg | SUBCUTANEOUS | 0 refills | Status: DC

## 2021-11-17 NOTE — Assessment & Plan Note (Signed)
Stable. Continue Lipitor 40 mg.  - Lipid panel today

## 2021-11-17 NOTE — Assessment & Plan Note (Addendum)
Follows with [employer] [facility]. Sees his nephrologist soon. GFR 43, Scr 2.04 om April 2021  - CMP

## 2021-11-17 NOTE — Assessment & Plan Note (Addendum)
A1c today 7.1 and previously 6.9-10.4. Continue Trulicity 1.5 MG once weekly. Samples given today. Add Marcelline Deist, hopefully able to get covered by pharmacy resources. Will reach out to pharmacy about [employer] application. Encouraged continued diet rich in vegetables and complex carbs.  Heart healthy carb modified diet. Counseled on need to continue exercising.   Statin therapy: Lipitor ACEi/ARB: Lisinopril  Urine microalbumine: follows with nephrologist Eye exam: advised to get eye exam

## 2021-11-17 NOTE — Patient Instructions (Signed)
It was great seeing you today!  Please check-out at the front desk before leaving the clinic. I'd like to see you back in 3 month.   Visit Remembers: - Stop by the pharmacy to pick up your prescriptions  - Continue to work on your healthy eating habits and incorporating exercise into your daily life.  - Your goal is to have an BP < 140/90 - Medicine Changes: Start Farxiga 10 mg.  - To Do: look out for a phone call about our medication application    For your hand: get a squishy call and squeeze it 3-5 times a day for the next month.  If you get pain or numbness come back to the office.  - stop by the office 2 weeks after taking the Marcelline Deist so we can check our blood sugars  Regarding lab work today:  Due to recent changes in healthcare laws, you may see the results of your imaging and laboratory studies on MyChart before your provider has had a chance to review them.  I understand that in some cases there may be results that are confusing or concerning to you. Not all laboratory results come back in the same time frame and you may be waiting for multiple results in order to interpret others.  Please give [place] 72 hours in order for your provider to thoroughly review all the results before contacting the office for clarification of your results. If everything is normal, you will get a letter in the mail or a message in My Chart. Please give [place] a call if you do not hear from [place] after 2 weeks.  Please bring all of your medications with you to each visit.    Feel free to call with any questions or concerns at any time, at [phone].   Take care,  Dr. Katherina [facility]

## 2021-11-17 NOTE — Assessment & Plan Note (Addendum)
Thumb, pointer and middle finger involvement. Has tender area on the right forearm, ?tendon sheath.  Recommended hand exercises 3-4x a day for the next month.  Red flag signs discussed and he has none. ED and return precautions given.  Follow up 1 month if not improving.

## 2021-11-17 NOTE — Progress Notes (Signed)
   SUBJECTIVE:   CHIEF COMPLAINT / HPI:   Chief Complaint  Patient presents with   Follow-up     Patrick [name] is a 50 y.o. male here for follow up:   Diabetes Mellitus  Tries to stay away from certain foods but "slides back" sometimes. Denies missing any doses of DM medications and takes Trulicity on Fridays . Ran out 2 weeks ago. Denies increased thirst, hunger, or frequent urination. Last checked his blood sugar about 2 weeks ago and his CBG was around 200.   HTN Denies missing doses of antihypertensive medications. Denies chest pain, palpitations, lower extremity edema, exertional dyspnea, lightheadedness,  and vision changes. Occassionally has headaches but these are not new.    Hand Issues  Has bilateral hand "cramping" of thumb, ring and middle fingers of both . Denies pain during these episodes which lasts less than a minute. States that his hands get stuck in the same position (right worse than left). Sx for the past year have been worsening in frequency.  Before it happened once a week and now 1-2 a week. Gets occipital headaches a couple times a month. Previously lifted weights in the past but no longer.     PERTINENT  PMH / PSH: reviewed and updated as appropriate   OBJECTIVE:   BP 120/70   Pulse 71   Ht 5\' 9"  (1.753 m)   Wt 249 lb (112.9 kg)   SpO2 96%   BMI 36.77 kg/m    GEN: pleasant well appearing male, in no acute distress  CV: regular rate and rhythm RESP: no increased work of breathing, clear to ascultation bilaterally MSK:  bilateral strength 5/5, normal sensation, normal ROM, no TTP of metacarpals, carpals, normal wrist flexion, extension, ulnar deviation  SKIN: warm, dry,   ASSESSMENT/PLAN:   HYPERTENSION, BENIGN ESSENTIAL Stable. BP at goal. Medications: labetalol 100 mg BID, amlodipine 10 mg, lisinopril-HCTZ 20-25mg ,  History of target organ damage: CKD Exercise: Encouraged to increase physical activity as tolerated.  Diet Pattern: Heart  healthy dietary choices discussed.  - CMP   CKD (chronic kidney disease), stage III (HCC) Follows with [facility]. Sees his nephrologist soon. GFR 43, Scr 2.04 om April 2021  - CMP  Hyperlipidemia Stable. Continue Lipitor 40 mg.  - Lipid panel today   Controlled diabetes mellitus type 2 with complications (HCC) A1c today 7.1 and previously 6.9-10.4. Continue Trulicity 1.5 MG once weekly. Samples given today. Add 04-28-2005, hopefully able to get covered by pharmacy resources. Will reach out to pharmacy about Marcelline Deist application. Encouraged continued diet rich in vegetables and complex carbs.  Heart healthy carb modified diet. Counseled on need to continue exercising.   Statin therapy: Lipitor ACEi/ARB: Lisinopril  Urine microalbumine: follows with nephrologist Eye exam: advised to get eye exam    Does not have health insurance Not currently working.   Trigger finger Thumb, pointer and middle finger involvement. Has tender area on the right forearm, ?tendon sheath.  Recommended hand exercises 3-4x a day for the next month.  Red flag signs discussed and he has none. ED and return precautions given.  Follow up 1 month if not improving.       [employer], DO PGY-3, [facility] 11/17/2021

## 2021-11-17 NOTE — Assessment & Plan Note (Signed)
Not currently working.

## 2021-11-17 NOTE — Assessment & Plan Note (Signed)
Stable. BP at goal. Medications: labetalol 100 mg BID, amlodipine 10 mg, lisinopril-HCTZ 20-25mg ,  History of target organ damage: CKD Exercise: Encouraged to increase physical activity as tolerated.  Diet Pattern: Heart healthy dietary choices discussed.  - CMP

## 2021-11-18 LAB — COMPREHENSIVE METABOLIC PANEL
ALT: 22 IU/L (ref 0–44)
AST: 18 IU/L (ref 0–40)
Albumin/Globulin Ratio: 1.6 (ref 1.2–2.2)
Albumin: 4.4 g/dL (ref 3.8–4.9)
Alkaline Phosphatase: 59 IU/L (ref 44–121)
BUN/Creatinine Ratio: 10 (ref 9–20)
BUN: 27 mg/dL — ABNORMAL HIGH (ref 6–24)
Bilirubin Total: <0.2 mg/dL (ref 0.0–1.2)
CO2: 24 mmol/L (ref 20–29)
Calcium: 9.1 mg/dL (ref 8.7–10.2)
Chloride: 108 mmol/L — ABNORMAL HIGH (ref 96–106)
Creatinine, Ser: 2.7 mg/dL — ABNORMAL HIGH (ref 0.76–1.27)
Globulin, Total: 2.8 g/dL (ref 1.5–4.5)
Glucose: 193 mg/dL — ABNORMAL HIGH (ref 70–99)
Potassium: 4.7 mmol/L (ref 3.5–5.2)
Sodium: 143 mmol/L (ref 134–144)
Total Protein: 7.2 g/dL (ref 6.0–8.5)
eGFR: 28 mL/min/{1.73_m2} — ABNORMAL LOW (ref >59)

## 2021-11-18 LAB — LIPID PANEL
Chol/HDL Ratio: 5.7 ratio — ABNORMAL HIGH (ref 0.0–5.0)
Cholesterol, Total: 147 mg/dL (ref 100–199)
HDL: 26 mg/dL — ABNORMAL LOW (ref >39)
LDL Chol Calc (NIH): 79 mg/dL (ref 0–99)
Triglycerides: 251 mg/dL — ABNORMAL HIGH (ref 0–149)
VLDL Cholesterol Cal: 42 mg/dL — ABNORMAL HIGH (ref 5–40)

## 2021-12-01 ENCOUNTER — Other Ambulatory Visit: Payer: Self-pay | Admitting: Family Medicine

## 2021-12-01 DIAGNOSIS — I10 Essential (primary) hypertension: Secondary | ICD-10-CM

## 2021-12-03 ENCOUNTER — Telehealth: Payer: Self-pay

## 2021-12-03 NOTE — Telephone Encounter (Signed)
Patient calls nurse line requesting to speak with pharmacy in regards to Trulicity application. Patient reports he submitted this and would like to check the status.   Will forward to Pharmacy.

## 2021-12-03 NOTE — Telephone Encounter (Signed)
Patient calls nurse line again requesting Trulicity samples.

## 2021-12-08 NOTE — Telephone Encounter (Signed)
Patient returned call to nurse line regarding Trulicity samples. Patient received approval today through [employer]. Spoke with Durward Mallard, that gave instructions to provide patient with one box of Trulicity to hold him until shipment from [facility] arrives.   Lot: [id number] C Exp: 03/15/2023  Veronda Prude, RN

## 2021-12-08 NOTE — Progress Notes (Signed)
Received notification from [facility] regarding approval for TRULICITY 1.5MG /0.5ML. Patient assistance approved from 12/08/21 to 12/08/22.  Phone: [phone]

## 2022-01-31 NOTE — Progress Notes (Signed)
Received notification from [employer] regarding TEMPORARY ENROLLMENT for FARXIGA. Patient approved JAN 2023 FOR THREE 30 DAY REFILLS.   An appeal was also started for patient. Pt aware he needs to apply for medicaid. If denied he needs to bring denial letter to submit to [employer].   Phone: [phone]

## 2022-03-03 ENCOUNTER — Other Ambulatory Visit: Payer: Self-pay | Admitting: Family Medicine

## 2022-03-03 DIAGNOSIS — I10 Essential (primary) hypertension: Secondary | ICD-10-CM

## 2022-03-05 ENCOUNTER — Emergency Department (HOSPITAL_BASED_OUTPATIENT_CLINIC_OR_DEPARTMENT_OTHER): Payer: Self-pay

## 2022-03-05 ENCOUNTER — Other Ambulatory Visit: Payer: Self-pay

## 2022-03-05 ENCOUNTER — Emergency Department (HOSPITAL_BASED_OUTPATIENT_CLINIC_OR_DEPARTMENT_OTHER)
Admission: EM | Admit: 2022-03-05 | Discharge: 2022-03-05 | Disposition: A | Discharge status: Home / Self Care | Payer: Self-pay | Attending: Emergency Medicine | Admitting: Emergency Medicine

## 2022-03-05 ENCOUNTER — Encounter (HOSPITAL_BASED_OUTPATIENT_CLINIC_OR_DEPARTMENT_OTHER): Payer: Self-pay

## 2022-03-05 DIAGNOSIS — N289 Disorder of kidney and ureter, unspecified: Secondary | ICD-10-CM

## 2022-03-05 DIAGNOSIS — R42 Dizziness and giddiness: Secondary | ICD-10-CM | POA: Insufficient documentation

## 2022-03-05 DIAGNOSIS — R0789 Other chest pain: Secondary | ICD-10-CM

## 2022-03-05 DIAGNOSIS — R0781 Pleurodynia: Secondary | ICD-10-CM | POA: Insufficient documentation

## 2022-03-05 DIAGNOSIS — R0602 Shortness of breath: Secondary | ICD-10-CM | POA: Insufficient documentation

## 2022-03-05 DIAGNOSIS — Z79899 Other long term (current) drug therapy: Secondary | ICD-10-CM | POA: Insufficient documentation

## 2022-03-05 DIAGNOSIS — Z7982 Long term (current) use of aspirin: Secondary | ICD-10-CM | POA: Insufficient documentation

## 2022-03-05 DIAGNOSIS — R1110 Vomiting, unspecified: Secondary | ICD-10-CM | POA: Insufficient documentation

## 2022-03-05 LAB — CBC WITH DIFFERENTIAL/PLATELET
Abs Immature Granulocytes: 0.01 10*3/uL (ref 0.00–0.07)
Basophils Absolute: 0.1 10*3/uL (ref 0.0–0.1)
Basophils Relative: 1 %
Eosinophils Absolute: 0.8 10*3/uL — ABNORMAL HIGH (ref 0.0–0.5)
Eosinophils Relative: 9 %
HCT: 38.3 % — ABNORMAL LOW (ref 39.0–52.0)
Hemoglobin: 13.3 g/dL (ref 13.0–17.0)
Immature Granulocytes: 0 %
Lymphocytes Relative: 31 %
Lymphs Abs: 2.6 10*3/uL (ref 0.7–4.0)
MCH: 31 pg (ref 26.0–34.0)
MCHC: 34.7 g/dL (ref 30.0–36.0)
MCV: 89.3 fL (ref 80.0–100.0)
Monocytes Absolute: 0.5 10*3/uL (ref 0.1–1.0)
Monocytes Relative: 6 %
Neutro Abs: 4.2 10*3/uL (ref 1.7–7.7)
Neutrophils Relative %: 53 %
Platelets: 248 10*3/uL (ref 150–400)
RBC: 4.29 MIL/uL (ref 4.22–5.81)
RDW: 12.7 % (ref 11.5–15.5)
WBC: 8.1 10*3/uL (ref 4.0–10.5)
nRBC: 0 % (ref 0.0–0.2)

## 2022-03-05 LAB — URINALYSIS, ROUTINE W REFLEX MICROSCOPIC
Bilirubin Urine: NEGATIVE
Glucose, UA: >=500 mg/dL — AB
Ketones, ur: NEGATIVE mg/dL
Leukocytes,Ua: NEGATIVE
Nitrite: NEGATIVE
Protein, ur: >=300 mg/dL — AB
Specific Gravity, Urine: 1.02 (ref 1.005–1.030)
pH: 5.5 (ref 5.0–8.0)

## 2022-03-05 LAB — COMPREHENSIVE METABOLIC PANEL
ALT: 24 U/L (ref 0–44)
AST: 23 U/L (ref 15–41)
Albumin: 4.2 g/dL (ref 3.5–5.0)
Alkaline Phosphatase: 55 U/L (ref 38–126)
Anion gap: 8 (ref 5–15)
BUN: 44 mg/dL — ABNORMAL HIGH (ref 6–20)
CO2: 26 mmol/L (ref 22–32)
Calcium: 9 mg/dL (ref 8.9–10.3)
Chloride: 102 mmol/L (ref 98–111)
Creatinine, Ser: 3.32 mg/dL — ABNORMAL HIGH (ref 0.61–1.24)
GFR, Estimated: 22 mL/min — ABNORMAL LOW (ref >60)
Glucose, Bld: 141 mg/dL — ABNORMAL HIGH (ref 70–99)
Potassium: 4.6 mmol/L (ref 3.5–5.1)
Sodium: 136 mmol/L (ref 135–145)
Total Bilirubin: 0.3 mg/dL (ref 0.3–1.2)
Total Protein: 8.2 g/dL — ABNORMAL HIGH (ref 6.5–8.1)

## 2022-03-05 LAB — URINALYSIS, MICROSCOPIC (REFLEX)

## 2022-03-05 LAB — CK: Total CK: 504 U/L — ABNORMAL HIGH (ref 49–397)

## 2022-03-05 MED ORDER — SODIUM CHLORIDE 0.9 % IV BOLUS
1000.0000 mL | Freq: Once | INTRAVENOUS | Status: AC
  Administered 2022-03-05: 1000 mL via INTRAVENOUS

## 2022-03-05 MED ORDER — LIDOCAINE 5 % EX PTCH: 1.0000 | MEDICATED_PATCH | CUTANEOUS | 0 refills | Status: DC

## 2022-03-05 MED ORDER — ONDANSETRON HCL 4 MG/2ML IJ SOLN: 4.0000 mg | Freq: Once | INTRAMUSCULAR | Status: DC

## 2022-03-05 NOTE — Discharge Instructions (Signed)
You likely strained muscle in your chest ? ?Take Tylenol for pain and try lidocaine patch  ? ?See your doctor for follow-up ? ?Your kidney function is progressively getting worse so you need to follow-up with your kidney doctor as scheduled on April 4 ? ?Return to ER if you have worse chest pain, trouble breathing, weakness, dizziness ?

## 2022-03-05 NOTE — ED Triage Notes (Addendum)
C/o pain to right ribs. States when he turns a certain way it hurts/swells x 2 days ? ?Also states he was taking out the trash yesterday when he became dizzy/lightheaded and vomited. ?

## 2022-03-05 NOTE — ED Provider Notes (Signed)
?[facility] EMERGENCY DEPARTMENT ?Provider Note ? ? ?CSN: [id number] ?Arrival date & time: 03/05/22  1447 ? ?  ? ?History ? ?Chief Complaint  ?Patient presents with  ? Chest Pain  ? ? ?Joren Rehm is a 51 y.o. male here presenting with right-sided chest wall pain.  Patient states that he noticed pain in the right ribs for 2 days.  He also noticed some swelling in the area.  Patient has some subjective shortness of breath as well.  Patient took out the trash yesterday and became dizzy and lightheaded.  He states that he is no longer dizzy and lightheaded and denies any trouble speaking or weakness.  Patient was concerned that he has a mass in his chest.  Patient denies any abdominal pain or weight loss.  Denies any fevers or chills. ? ?The history is provided by the patient.  ? ?  ? ?Home Medications ?Prior to Admission medications   ?Medication Sig Start Date End Date Taking? Authorizing Provider  ?amLODipine (NORVASC) 10 MG tablet TAKE 1 TABLET BY MOUTH AT BEDTIME 12/01/21   Katha Cabal, DO  ?aspirin EC 81 MG tablet Take 1 tablet (81 mg total) by mouth daily. 10/31/14   Leona Singleton, MD  ?atorvastatin (LIPITOR) 40 MG tablet Take 1 tablet (40 mg total) by mouth daily. 03/04/20   Caro Laroche, DO  ?dapagliflozin propanediol (FARXIGA) 10 MG TABS tablet Take 1 tablet (10 mg total) by mouth daily. 11/17/21   Katha Cabal, DO  ?Dulaglutide (TRULICITY) 1.5 MG/0.5ML SOPN Inject 0.5 mLs (1.5 mg total) into the skin once a week. 07/16/20   Kathrin Ruddy, RPH-CPP  ?Dulaglutide (TRULICITY) 1.5 MG/0.5ML SOPN Inject 1.5 mg into the skin once a week. 11/17/21   Katha Cabal, DO  ?furosemide (LASIX) 20 MG tablet Take 20 mg by mouth.    [provider]  ?gabapentin (NEURONTIN) 300 MG capsule Take 1 capsule by mouth at bedtime 08/05/21   Katha Cabal, DO  ?glucose blood (WAVESENSE PRESTO) test strip Use as instructed - Dispense QS for up to twice daily testing. 01/02/14   Moses Manners,  MD  ?Insulin Pen Needle 32G X 4 MM MISC Use with Lantus pen. Change needles with each use. 07/24/19   Caro Laroche, DO  ?labetalol (NORMODYNE) 100 MG tablet Take 100 mg by mouth 2 (two) times daily. 03/10/20   Terrial Rhodes, MD  ?Lancets 30G MISC 1 each by Does not apply route 3 (three) times daily. 11/02/18   Caro Laroche, DO  ?lisinopril-hydrochlorothiazide (ZESTORETIC) 20-25 MG tablet Take 1 tablet by mouth once daily 03/03/22   Katha Cabal, DO  ?   ? ?Allergies    ?Patient has no known allergies.   ? ?Review of Systems   ?Review of Systems  ?Cardiovascular:  Positive for chest pain.  ?All other systems reviewed and are negative. ? ?Physical Exam ?Updated Vital Signs ?BP (!) 158/87 (BP Location: Right Arm)   Pulse 77   Temp 98.2 ?F (36.8 ?C) (Oral)   Resp 16   Ht 5\' 9"  (1.753 m)   Wt 113.4 kg   SpO2 100%   BMI 36.92 kg/m?  ?Physical Exam ?Vitals and nursing note reviewed.  ?Constitutional:   ?   Appearance: He is well-developed.  ?HENT:  ?   Head: Normocephalic.  ?Eyes:  ?   Extraocular Movements: Extraocular movements intact.  ?   Pupils: Pupils are equal, round, and reactive to light.  ?Cardiovascular:  ?  Rate and Rhythm: Normal rate and regular rhythm.  ?   Heart sounds: Normal heart sounds.  ?Pulmonary:  ?   Comments: Patient has tenderness in the right ribs.  No obvious palpable mass.  No obvious deformity.  Lungs are clear ?Abdominal:  ?   General: Bowel sounds are normal.  ?   Palpations: Abdomen is soft.  ?Musculoskeletal:     ?   General: Normal range of motion.  ?   Cervical back: Normal range of motion and neck supple.  ?Skin: ?   General: Skin is warm.  ?Neurological:  ?   General: No focal deficit present.  ?   Mental Status: He is alert and oriented to person, place, and time.  ?Psychiatric:     ?   Mood and Affect: Mood normal.     ?   Behavior: Behavior normal.  ? ? ?ED Results / Procedures / Treatments   ?Labs ?(all labs ordered are listed, but only abnormal results are  displayed) ?Labs Reviewed  ?CBC WITH DIFFERENTIAL/PLATELET  ?COMPREHENSIVE METABOLIC PANEL  ? ? ?EKG ?EKG Interpretation ? ?Date/Time:  Saturday March 05 2022 15:08:54 EDT ?Ventricular Rate:  74 ?PR Interval:  184 ?QRS Duration: 94 ?QT Interval:  397 ?QTC Calculation: 441 ?R Axis:   10 ?Text Interpretation: Sinus rhythm Borderline low voltage, extremity leads No significant change since last tracing Confirmed by Richardean CanalYao, Cashe Gatt H [phone]([id number]) on 03/05/2022 3:24:01 PM ? ?Radiology ?No results found. ? ?Procedures ?Procedures  ? ? ?Medications Ordered in ED ?Medications  ?ondansetron (ZOFRAN) injection 4 mg (has no administration in time range)  ?sodium chloride 0.9 % bolus 1,000 mL (has no administration in time range)  ? ? ?ED Course/ Medical Decision Making/ A&P ?  ?                        ?Medical Decision Making ?Orvan SeenHerman Bargo is a 51 y.o. male here with right-sided chest wall pain.  I suspect muscle strain.  However given that he palpated a mass and he has some shortness of breath, will get CTA to rule out PE or mass.  Patient has no left-sided chest pain and I do not think he has ACS.  Plan to get CBC and BMP and CTA chest.  Patient had some lightheaded and dizziness yesterday but had no dizziness today and no focal neurologic deficit and I do not think he needs neuroimaging currently.  ?  ?5:39 PM ?Patient's creatinine went up to 3.3 CK level is 500 patient received 1 L bolus.  Unable to get IV contrast due to renal failure and CT noncontrast did not show any hydronephrosis or obvious mass.  I think his symptoms are likely from muscle strain of his chest wall.  Also consider worsening renal failure causing his symptoms.  I talked to Dr. Arlean HoppingSchertz from nephrology.  He is last creatinine was 2.7 in December last year.  He states that this is likely slow progression of his renal failure and does not need admission currently.  Patient states that he has follow-up with Dr. Johnnette Barriosolodenado on April 4.  I reviewed his meds and told  him that he should avoid any NSAIDs.  He can try the lidocaine patch for muscle spasms.  Stable for discharge at this point ? ?Problems Addressed: ?Chest wall pain: acute illness or injury ?Renal insufficiency: acute illness or injury ? ?Amount and/or Complexity of Data Reviewed ?Labs: ordered. Decision-making details documented in ED Course. ?Radiology: ordered  and independent interpretation performed. Decision-making details documented in ED Course. ?ECG/medicine tests: ordered and independent interpretation performed. Decision-making details documented in ED Course. ? ?Risk ?Prescription drug management. ? ?Final Clinical Impression(s) / ED Diagnoses ?Final diagnoses:  ?None  ? ? ?Rx / DC Orders ?ED Discharge Orders   ? ? None  ? ?  ? ? ?  ?Charlynne Pander, MD ?03/05/22 1742 ? ?

## 2022-03-31 ENCOUNTER — Other Ambulatory Visit: Payer: Self-pay | Admitting: Family Medicine

## 2022-03-31 DIAGNOSIS — I10 Essential (primary) hypertension: Secondary | ICD-10-CM

## 2022-05-01 ENCOUNTER — Other Ambulatory Visit: Payer: Self-pay | Admitting: Family Medicine

## 2022-05-01 DIAGNOSIS — I10 Essential (primary) hypertension: Secondary | ICD-10-CM

## 2022-06-13 ENCOUNTER — Telehealth: Payer: Self-pay

## 2022-06-13 NOTE — Telephone Encounter (Signed)
Returned pt's phone call.  Reminded pt he needed to apply for medicaid in order to receive enrollment from [employer]. His temporary enrollment from January ended around the end of march of April/ Pt says he applied for medicaidf but never heard back and that he would call to follow up about it. I didn't see any active enrollment on [place] [employer].  Pt will follow up

## 2022-06-13 NOTE — Telephone Encounter (Signed)
Patient calls nurse line requesting to speak with [facility].   Patient reports he has been getting [place] through medication assistance, however his "trial" has run out.   Patient was getting it appears ~3 month supply through [employer].   Patient was told to contact his PCPs office to get re-established.   Will forward to [place] for assistance.

## 2022-06-16 ENCOUNTER — Ambulatory Visit: Payer: Self-pay | Admitting: Student

## 2022-06-16 NOTE — Progress Notes (Deleted)
  SUBJECTIVE:   CHIEF COMPLAINT / HPI:   Diabetes Mellitus Type II, Follow-up  Lab Results  Component Value Date   HGBA1C 7.1 (A) 11/17/2021   HGBA1C 6.9 08/03/2020   HGBA1C 9.3 (A) 03/04/2020   Wt Readings from Last 3 Encounters:  03/05/22 250 lb (113.4 kg)  11/17/21 249 lb (112.9 kg)  08/03/20 253 lb 12.8 oz (115.1 kg)   Last seen for diabetes {1-12:18279} {days/wks/mos/yrs:310907} ago.  Management since then includes ***. He reports {excellent/good/fair/poor:19665} compliance with treatment. He {is/is not:21021397} having side effects. {document side effects if present:1} Symptoms: {Yes/No:20286} fatigue {Yes/No:20286} foot ulcerations  {Yes/No:20286} appetite changes {Yes/No:20286} nausea  {Yes/No:20286} paresthesia of the feet  {Yes/No:20286} polydipsia  {Yes/No:20286} polyuria {Yes/No:20286} visual disturbances   {Yes/No:20286} vomiting     Home blood sugar records: {diabetes glucometry results:16657}  Episodes of hypoglycemia? {Yes/No:20286} {enter symptoms and frequency of symptoms if yes:1}   Current insulin regiment: {enter 'none' or type of insulin and number of units taken with each dose of each insulin formulation that the patient is taking:1} Most Recent Eye Exam: *** {Current exercise:16438:::1} {Current diet habits:16563:::1}  Pertinent Labs: Lab Results  Component Value Date   CHOL 147 11/17/2021   HDL 26 (L) 11/17/2021   LDLCALC 79 11/17/2021   LDLDIRECT 47 08/03/2020   TRIG 251 (H) 11/17/2021   CHOLHDL 5.7 (H) 11/17/2021   Lab Results  Component Value Date   NA 136 03/05/2022   K 4.6 03/05/2022   CREATININE 3.32 (H) 03/05/2022   GFRNONAA 22 (L) 03/05/2022   MICROALBUR 150 03/04/2020     ---------------------------------------------------------------------------------------------------  Hypertension:   today. Home medications include: ***. He {Blank single:19197::"endorses","does not endorse"} taking these medications as prescribed. {Blank  single:19197::"Does","Does not"} check blood pressure at home.*** Denies any SOB, CP, vision changes, LE edema, medication SEs, or symptoms of hypotension. Diet ***. Exercise ***. Most recent creatinine trend:  Lab Results  Component Value Date   CREATININE 3.32 (H) 03/05/2022   CREATININE 2.70 (H) 11/17/2021   CREATININE 2.04 (H) 03/16/2020   Patient {HAS HAS HTD:42876} had a BMP in the past 1 year.  CKD: Follows  Kidney    PERTINENT  PMH / PSH: ***  Past Medical History:  Diagnosis Date   Diabetes mellitus without complication (HCC)    Hyperlipidemia    Hypertension    Obesity     OBJECTIVE:  There were no vitals taken for this visit.  General: NAD, pleasant, able to participate in exam Cardiac: RRR, no murmurs auscultated Respiratory: CTAB, normal WOB Abdomen: soft, non-tender, non-distended, normoactive bowel sounds Extremities: warm and well perfused, no edema or cyanosis Skin: warm and dry, no rashes noted Neuro: alert, no obvious focal deficits, speech normal Psych: Normal affect and mood  ASSESSMENT/PLAN:  No problem-specific Assessment & Plan notes found for this encounter.   No orders of the defined types were placed in this encounter.  No orders of the defined types were placed in this encounter.  No follow-ups on file.  Alfredo Martinez, MD PGY-2 Family Medicine  {    This will disappear when note is signed, click to select method of visit    :1}

## 2022-06-23 ENCOUNTER — Ambulatory Visit (INDEPENDENT_AMBULATORY_CARE_PROVIDER_SITE_OTHER): Payer: Self-pay | Admitting: Student

## 2022-06-23 ENCOUNTER — Encounter: Payer: Self-pay | Admitting: Student

## 2022-06-23 VITALS — BP 150/80 | HR 78 | Ht 69.0 in | Wt 262.6 lb

## 2022-06-23 DIAGNOSIS — Z5989 Other problems related to housing and economic circumstances: Secondary | ICD-10-CM

## 2022-06-23 DIAGNOSIS — M7989 Other specified soft tissue disorders: Secondary | ICD-10-CM

## 2022-06-23 DIAGNOSIS — Z0000 Encounter for general adult medical examination without abnormal findings: Secondary | ICD-10-CM

## 2022-06-23 DIAGNOSIS — N183 Chronic kidney disease, stage 3 unspecified: Secondary | ICD-10-CM

## 2022-06-23 DIAGNOSIS — E118 Type 2 diabetes mellitus with unspecified complications: Secondary | ICD-10-CM

## 2022-06-23 DIAGNOSIS — Z1211 Encounter for screening for malignant neoplasm of colon: Secondary | ICD-10-CM

## 2022-06-23 DIAGNOSIS — I10 Essential (primary) hypertension: Secondary | ICD-10-CM

## 2022-06-23 LAB — POCT GLYCOSYLATED HEMOGLOBIN (HGB A1C): HbA1c, POC (controlled diabetic range): 8.2 % — AB (ref 0.0–7.0)

## 2022-06-23 MED ORDER — AMLODIPINE BESYLATE 5 MG PO TABS: 5.0000 mg | ORAL_TABLET | Freq: Every day | ORAL | 1 refills | Status: DC

## 2022-06-23 MED ORDER — TRULICITY 3 MG/0.5ML ~~LOC~~ SOAJ: 3.0000 mg | SUBCUTANEOUS | 3 refills | Status: DC

## 2022-06-23 NOTE — Assessment & Plan Note (Signed)
Increase Trulicity to 3 mg  Needs farxiga, will try to have him get insurance  A1C 7.4>8.1  Follow up with nephro

## 2022-06-23 NOTE — Assessment & Plan Note (Signed)
Norvasc decreased to 5 mg given side effect of leg swelling  Increase Lasix to 40 mg x4 days and then decrease back to 20 mg  Continue Lisinopril + HCTZ and Labetolol

## 2022-06-23 NOTE — Assessment & Plan Note (Signed)
May contribute to swelling, needs nephrology f/u

## 2022-06-23 NOTE — Progress Notes (Signed)
SUBJECTIVE:   CHIEF COMPLAINT / HPI:   Diabetes Mellitus Type II, Follow-up:  Patient here for follow-up of Type 2 diabetes mellitus.    Known diabetic complications:  CKD  Cardiovascular risk factors: diabetes mellitus, male gender, and obesity (BMI >= 30 kg/m2) Current diabetic medications include  Trulicity, Marcelline Deist (not taking [place] due to cost without insurance .   Weight trend: increasing steadily  Is He on ACE inhibitor or angiotensin II receptor blocker?  Yes  lisinopril (Prinivil) + HCTZ  LEG SWELLING Having leg swelling for over a year, worsening now and stays swollen but improves when laying down   Location: up to the knee  Timing of swelling: Constant  New medications: None  Patient believes may be caused by None   History of Kidney problems: CKD stage 4; next appt august 13th  History of Heart problems: No History of Liver problems: No History of cancer: No Smoker: No  Symptoms Chest pain: yesterday- a minute long  Shortness of breath: catches breath Immobility: Pain in legs  Black or bloody bowel movements: No Abdomen swelling: No History of blood clots: No  Weight gain noted by patient  Patient feels abdominal bloating now as well   He has chronic lower extremity swelling that seems to be related to CKD, has nephro appt in August 2023.  Seems that the swelling has been WORSENING the pat couple of weeks    PERTINENT  PMH / PSH:   Past Medical History:  Diagnosis Date   Diabetes mellitus without complication (HCC)    Hyperlipidemia    Hypertension    Obesity     OBJECTIVE:  BP (!) 150/80   Pulse 78   Ht 5\' 9"  (1.753 m)   Wt 262 lb 9.6 oz (119.1 kg)   SpO2 100%   BMI 38.78 kg/m   General: NAD, pleasant, able to participate in exam; obese AA M Cardiac: RRR, no murmurs auscultated Respiratory: CTAB, normal WOB Abdomen: soft, non-tender, non-distended, normoactive bowel sounds, negative fluid wave  Extremities: warm and well perfused, DP  pulses palpable, 3+ pitting edema to the knee, no ulceration or drainage   Skin: warm and dry, no rashes noted Neuro: alert, no obvious focal deficits, speech normal Psych: Normal affect and mood  ASSESSMENT/PLAN:  Does not have health insurance Holding on colonoscopy, HIV, Hep C to prevent excess cost  HYPERTENSION, BENIGN ESSENTIAL Norvasc decreased to 5 mg given side effect of leg swelling  Increase Lasix to 40 mg x4 days and then decrease back to 20 mg  Continue Lisinopril + HCTZ and Labetolol  Controlled diabetes mellitus type 2 with complications (HCC) Increase Trulicity to 3 mg  Needs farxiga, will try to have him get insurance  A1C 7.4>8.1  Follow up with nephro   CKD (chronic kidney disease), stage III (HCC) May contribute to swelling, needs nephrology f/u  Leg swelling Unsure etiology, CMP, CBC, BNP ordered for assessment of liver, kidney, heart functioning  Compression stocking and elevated legs  Decreased Amlodipine  Follow up in 2 weeks      Orders Placed This Encounter  Procedures   Comprehensive metabolic panel   CBC with Differential   Brain natriuretic peptide   Lipid panel   HgB A1c   Meds ordered this encounter  Medications   amLODipine (NORVASC) 5 MG tablet    Sig: Take 1 tablet (5 mg total) by mouth at bedtime.    Dispense:  30 tablet    Refill:  1  Dulaglutide (TRULICITY) 3 MG/0.5ML SOPN    Sig: Inject 3 mg into the skin once a week.    Dispense:  0.5 mL    Refill:  3   Return in about 2 weeks (around 07/07/2022).   Patrick Martinez, MD PGY-2 Family Medicine

## 2022-06-23 NOTE — Patient Instructions (Addendum)
It was great to see you today! Thank you for choosing [facility] for your primary care. Patrick [name] was seen for leg swelling.  Today we addressed: Continue with compression stocking and elevate legs  Decrease Amlodipine to 5 mg  Increase trulicity  Double up on your Lasix for 4 days to assist with swelling   If you haven't already, sign up for My Chart to have easy access to your labs results, and communication with your primary care physician.  We are checking some labs today. If they are abnormal, I will call you. If they are normal, I will send you a MyChart message (if it is active) or a letter in the mail. If you do not hear about your labs in the next 2 weeks, please call the office.   You should return to our clinic Return in about 2 weeks (around 07/07/2022).  I recommend that you always bring your medications to each appointment as this makes it easy to ensure you are on the correct medications and helps [place] not miss refills when you need them.  Please arrive 15 minutes before your appointment to ensure smooth check in process.  We appreciate your efforts in making this happen.  Please call the clinic at [phone] if your symptoms worsen or you have any concerns.  Thank you for allowing me to participate in your care, Alfredo Martinez, MD PGY-2 Family Medicine

## 2022-06-23 NOTE — Assessment & Plan Note (Signed)
Holding on colonoscopy, HIV, Hep C to prevent excess cost

## 2022-06-23 NOTE — Assessment & Plan Note (Signed)
Unsure etiology, CMP, CBC, BNP ordered for assessment of liver, kidney, heart functioning  Compression stocking and elevated legs  Decreased Amlodipine  Follow up in 2 weeks

## 2022-06-25 LAB — COMPREHENSIVE METABOLIC PANEL
ALT: 22 IU/L (ref 0–44)
AST: 20 IU/L (ref 0–40)
Albumin/Globulin Ratio: 1.3 (ref 1.2–2.2)
Albumin: 4 g/dL (ref 3.8–4.9)
Alkaline Phosphatase: 65 IU/L (ref 44–121)
BUN/Creatinine Ratio: 15 (ref 9–20)
BUN: 47 mg/dL — ABNORMAL HIGH (ref 6–24)
Bilirubin Total: <0.2 mg/dL (ref 0.0–1.2)
CO2: 20 mmol/L (ref 20–29)
Calcium: 9.2 mg/dL (ref 8.7–10.2)
Chloride: 103 mmol/L (ref 96–106)
Creatinine, Ser: 3.05 mg/dL — ABNORMAL HIGH (ref 0.76–1.27)
Globulin, Total: 3 g/dL (ref 1.5–4.5)
Glucose: 170 mg/dL — ABNORMAL HIGH (ref 70–99)
Potassium: 4.9 mmol/L (ref 3.5–5.2)
Sodium: 137 mmol/L (ref 134–144)
Total Protein: 7 g/dL (ref 6.0–8.5)
eGFR: 24 mL/min/{1.73_m2} — ABNORMAL LOW (ref >59)

## 2022-06-25 LAB — CBC WITH DIFFERENTIAL/PLATELET
Basophils Absolute: 0.1 10*3/uL (ref 0.0–0.2)
Basos: 1 %
EOS (ABSOLUTE): 0.6 10*3/uL — ABNORMAL HIGH (ref 0.0–0.4)
Eos: 8 %
Hematocrit: 37.3 % — ABNORMAL LOW (ref 37.5–51.0)
Hemoglobin: 13 g/dL (ref 13.0–17.7)
Immature Grans (Abs): 0 10*3/uL (ref 0.0–0.1)
Immature Granulocytes: 0 %
Lymphocytes Absolute: 2.5 10*3/uL (ref 0.7–3.1)
Lymphs: 32 %
MCH: 31.1 pg (ref 26.6–33.0)
MCHC: 34.9 g/dL (ref 31.5–35.7)
MCV: 89 fL (ref 79–97)
Monocytes Absolute: 0.5 10*3/uL (ref 0.1–0.9)
Monocytes: 6 %
Neutrophils Absolute: 4.2 10*3/uL (ref 1.4–7.0)
Neutrophils: 53 %
Platelets: 239 10*3/uL (ref 150–450)
RBC: 4.18 x10E6/uL (ref 4.14–5.80)
RDW: 12.9 % (ref 11.6–15.4)
WBC: 7.8 10*3/uL (ref 3.4–10.8)

## 2022-06-25 LAB — LIPID PANEL
Chol/HDL Ratio: 5.7 ratio — ABNORMAL HIGH (ref 0.0–5.0)
Cholesterol, Total: 165 mg/dL (ref 100–199)
HDL: 29 mg/dL — ABNORMAL LOW (ref >39)
LDL Chol Calc (NIH): 87 mg/dL (ref 0–99)
Triglycerides: 292 mg/dL — ABNORMAL HIGH (ref 0–149)
VLDL Cholesterol Cal: 49 mg/dL — ABNORMAL HIGH (ref 5–40)

## 2022-06-25 LAB — BRAIN NATRIURETIC PEPTIDE: BNP: 43.9 pg/mL (ref 0.0–100.0)

## 2022-06-27 ENCOUNTER — Other Ambulatory Visit (HOSPITAL_COMMUNITY): Payer: Self-pay

## 2022-06-28 ENCOUNTER — Other Ambulatory Visit: Payer: Self-pay | Admitting: Student

## 2022-06-28 ENCOUNTER — Telehealth: Payer: Self-pay | Admitting: Student

## 2022-06-28 ENCOUNTER — Other Ambulatory Visit (HOSPITAL_COMMUNITY): Payer: Self-pay

## 2022-06-28 DIAGNOSIS — E118 Type 2 diabetes mellitus with unspecified complications: Secondary | ICD-10-CM

## 2022-06-28 DIAGNOSIS — E7800 Pure hypercholesterolemia, unspecified: Secondary | ICD-10-CM

## 2022-06-28 MED ORDER — TRULICITY 3 MG/0.5ML ~~LOC~~ SOAJ: 3.0000 mg | SUBCUTANEOUS | 3 refills | Status: DC

## 2022-06-28 MED ORDER — ATORVASTATIN CALCIUM 40 MG PO TABS
40.0000 mg | ORAL_TABLET | Freq: Every day | ORAL | 0 refills | Status: DC
  Filled 2022-06-28 (×2): qty 30, 30d supply, fill #0

## 2022-06-28 NOTE — Progress Notes (Signed)
Lipitor sent to [facility] pharmacy as it is 10-20$ without insurance to help with financial strain. Will call and update patient

## 2022-06-28 NOTE — Telephone Encounter (Signed)
Attempted to call patient, was unable to leave voicemail and patient did not answer.  I was able to get the atorvastatin 40 mg at a reduced price through the [facility] outpatient pharmacy, prescription sent.  Additionally, I ordered the Trulicity through the appropriate channels to get it covered by [employer], will continue coming to his house.  Will attempt to call again.

## 2022-07-06 ENCOUNTER — Other Ambulatory Visit (HOSPITAL_COMMUNITY): Payer: Self-pay

## 2022-07-29 ENCOUNTER — Other Ambulatory Visit: Payer: Self-pay | Admitting: Family Medicine

## 2022-07-29 DIAGNOSIS — I10 Essential (primary) hypertension: Secondary | ICD-10-CM

## 2022-08-31 ENCOUNTER — Other Ambulatory Visit: Payer: Self-pay | Admitting: Student

## 2022-08-31 DIAGNOSIS — I10 Essential (primary) hypertension: Secondary | ICD-10-CM

## 2022-10-19 ENCOUNTER — Other Ambulatory Visit: Payer: Self-pay | Admitting: Student

## 2022-10-19 DIAGNOSIS — I10 Essential (primary) hypertension: Secondary | ICD-10-CM

## 2022-11-01 ENCOUNTER — Other Ambulatory Visit: Payer: Self-pay

## 2022-11-01 MED ORDER — GABAPENTIN 300 MG PO CAPS: 300.0000 mg | ORAL_CAPSULE | Freq: Every day | ORAL | 2 refills | Status: DC

## 2022-11-30 ENCOUNTER — Other Ambulatory Visit: Payer: Self-pay | Admitting: Student

## 2022-11-30 DIAGNOSIS — I10 Essential (primary) hypertension: Secondary | ICD-10-CM

## 2022-12-11 ENCOUNTER — Other Ambulatory Visit: Payer: Self-pay | Admitting: Family Medicine

## 2022-12-13 ENCOUNTER — Other Ambulatory Visit (HOSPITAL_COMMUNITY): Payer: Self-pay

## 2022-12-14 ENCOUNTER — Other Ambulatory Visit: Payer: Self-pay | Admitting: Student

## 2022-12-14 DIAGNOSIS — E118 Type 2 diabetes mellitus with unspecified complications: Secondary | ICD-10-CM

## 2022-12-14 MED ORDER — TRULICITY 3 MG/0.5ML ~~LOC~~ SOAJ: 3.0000 mg | SUBCUTANEOUS | 3 refills | Status: DC

## 2023-01-03 ENCOUNTER — Telehealth: Payer: Self-pay | Admitting: *Deleted

## 2023-01-03 NOTE — Telephone Encounter (Signed)
Patient left message on referral line asking to speak with you regarding recertification for the [facility] for his Trulicity.  Will forward to PharmT.  Eleni Frank,CMA

## 2023-01-05 ENCOUNTER — Other Ambulatory Visit (HOSPITAL_COMMUNITY): Payer: Self-pay

## 2023-01-05 NOTE — Telephone Encounter (Signed)
Reached out to patient regarding PAP.  Enrollment with [employer] ended 12/08/22.  Informed patient he needs to be seen to follow up on medications. Advised him to also apply for medicaid in the meantime.

## 2023-01-20 ENCOUNTER — Other Ambulatory Visit: Payer: Self-pay | Admitting: Student

## 2023-01-20 DIAGNOSIS — I10 Essential (primary) hypertension: Secondary | ICD-10-CM

## 2023-02-01 ENCOUNTER — Other Ambulatory Visit: Payer: Self-pay | Admitting: Student

## 2023-03-03 ENCOUNTER — Other Ambulatory Visit: Payer: Self-pay | Admitting: Student

## 2023-03-03 DIAGNOSIS — I10 Essential (primary) hypertension: Secondary | ICD-10-CM

## 2023-03-06 NOTE — Telephone Encounter (Signed)
Needs appt before next refill

## 2023-03-23 ENCOUNTER — Telehealth: Payer: Self-pay

## 2023-03-23 DIAGNOSIS — E118 Type 2 diabetes mellitus with unspecified complications: Secondary | ICD-10-CM

## 2023-03-23 NOTE — Telephone Encounter (Signed)
Reached back out to pt to inform him we do not have any samples and he should keep his appt 04/25/23 in order to continue [employer]. Also reminded pt to apply for medicaid.

## 2023-03-23 NOTE — Telephone Encounter (Signed)
Patient calls nurse line requesting samples of Trulicity.   He reports he has the paperwork for medication assistance, however reports he has to apply for Medicaid first.   He reports he is completely out of medication.   Will forward to pharmacy team.

## 2023-03-28 ENCOUNTER — Other Ambulatory Visit: Payer: Self-pay

## 2023-03-28 ENCOUNTER — Other Ambulatory Visit (HOSPITAL_COMMUNITY): Payer: Self-pay

## 2023-03-28 MED ORDER — TRULICITY 3 MG/0.5ML ~~LOC~~ SOAJ
3.0000 mg | SUBCUTANEOUS | 3 refills | Status: DC
  Filled 2023-03-28 – 2023-03-29 (×2): qty 2, 28d supply, fill #0

## 2023-03-28 NOTE — Addendum Note (Signed)
Addended by: Alfredo Martinez on: 03/28/2023 01:50 PM   Modules accepted: Orders

## 2023-03-28 NOTE — Telephone Encounter (Signed)
Patient returns call to nurse line regarding diabetes medication management. He does not have appointment until 5/14. He reports that he has not had any Trulicity for the last few weeks.   He is asking if we have any samples of alternatives to get him until this appointment. Will forward to PCP and pharmacy team for further advisement.   Veronda Prude, RN

## 2023-03-29 ENCOUNTER — Other Ambulatory Visit: Payer: Self-pay

## 2023-03-29 ENCOUNTER — Other Ambulatory Visit (HOSPITAL_COMMUNITY): Payer: Self-pay

## 2023-03-30 ENCOUNTER — Other Ambulatory Visit: Payer: Self-pay

## 2023-03-31 ENCOUNTER — Other Ambulatory Visit: Payer: Self-pay

## 2023-03-31 ENCOUNTER — Other Ambulatory Visit (HOSPITAL_COMMUNITY): Payer: Self-pay

## 2023-03-31 ENCOUNTER — Other Ambulatory Visit: Payer: Self-pay | Admitting: Student

## 2023-03-31 DIAGNOSIS — E118 Type 2 diabetes mellitus with unspecified complications: Secondary | ICD-10-CM

## 2023-03-31 MED ORDER — TRULICITY 3 MG/0.5ML ~~LOC~~ SOAJ
3.0000 mg | SUBCUTANEOUS | 3 refills | Status: DC
  Filled 2023-03-31: qty 2, 28d supply, fill #0

## 2023-04-08 ENCOUNTER — Other Ambulatory Visit: Payer: Self-pay | Admitting: Student

## 2023-04-08 DIAGNOSIS — I10 Essential (primary) hypertension: Secondary | ICD-10-CM

## 2023-04-20 ENCOUNTER — Other Ambulatory Visit: Payer: Self-pay | Admitting: Student

## 2023-04-20 DIAGNOSIS — I10 Essential (primary) hypertension: Secondary | ICD-10-CM

## 2023-04-24 ENCOUNTER — Telehealth: Payer: Self-pay

## 2023-04-24 NOTE — Progress Notes (Signed)
   04/24/2023  Patient ID: Patrick [name] Seen, male   DOB: [birth year], 52 y.o.   MRN: [id number]  Patient attempted to be outreached by Haze Boyden, PharmD Candidate on 04/24/23 to discuss hypertension. Left voicemail for patient to return our call at their convenience at [phone].  Haze Boyden, Student-PharmD

## 2023-04-25 ENCOUNTER — Ambulatory Visit (INDEPENDENT_AMBULATORY_CARE_PROVIDER_SITE_OTHER): Payer: Self-pay | Admitting: Student

## 2023-04-25 ENCOUNTER — Encounter: Payer: Self-pay | Admitting: Student

## 2023-04-25 VITALS — BP 164/88 | HR 73 | Ht 69.0 in | Wt 255.4 lb

## 2023-04-25 DIAGNOSIS — E119 Type 2 diabetes mellitus without complications: Secondary | ICD-10-CM

## 2023-04-25 DIAGNOSIS — E1165 Type 2 diabetes mellitus with hyperglycemia: Secondary | ICD-10-CM

## 2023-04-25 DIAGNOSIS — I10 Essential (primary) hypertension: Secondary | ICD-10-CM

## 2023-04-25 LAB — POCT GLYCOSYLATED HEMOGLOBIN (HGB A1C): HbA1c, POC (controlled diabetic range): 10.3 % — AB (ref 0.0–7.0)

## 2023-04-25 MED ORDER — HUMULIN 70/30 KWIKPEN (70-30) 100 UNIT/ML ~~LOC~~ SUPN
4.0000 [IU] | PEN_INJECTOR | Freq: Two times a day (BID) | SUBCUTANEOUS | 3 refills | Status: DC
Start: 2023-04-25 — End: 2023-11-20

## 2023-04-25 MED ORDER — LABETALOL HCL 200 MG PO TABS
200.0000 mg | ORAL_TABLET | Freq: Two times a day (BID) | ORAL | 1 refills | Status: AC
Start: 2023-04-25 — End: ?

## 2023-04-25 NOTE — Patient Instructions (Addendum)
It was great to see you today! Thank you for choosing [facility] for your primary care. Patrick [name] was seen for follow up.  Today we addressed:  I have ordered insulin 70/30: Inject 6 units in the morning before breakfast and 4 units in afternoon before dinner  Check sugar twice a day, keep a log of your sugars   Additionally, continue with the Trulicity -- I will send a message to [place] for [employer] to increase the dosage   Continue with the Amlodipine, Lisinopril-HCTZ, Lasix, Labetolol was increased to 200 twice a day   Medicaid is expanding as of November 11, 2022.  To apply: Go online to [email or web address]  OR  Call your local Department of Social Services(DSS) and complete a telephone application. Phone: [phone] OR  Lenox Ahr out a paper application and either mail, fax, email or drop off the application to your local DSS office. Addresses are below  OR  Apply in person at your local Department of Social Services (DSS) office. Addresses are below   Where to [employer] your Application:   [facility] ([facility])  [address], [place], [place] [address]  [facility] [address] [place], [place] [address] ?  You should be eligible for Medicaid if:   You live in [place] You are ages 57 through 52 You are a citizen (some non-US citizens can also get health care coverage through [facility]). And if your household income falls within the chart below:   sehold size Total income, before taxes  Single Adults $1,676/month or less     ($20,120/year)  Family of 2 $2,267/month or less      ($27,214/year)  Family of 3 $2,859/month or less  ($34,307/year)  Family of 4 $3,450/month or less  ($41,400/year)  Each additional person add $591/month (add $7,094/year)   Information you will need:  Full legal name Date of birth Social Security number (or immigration documents) [employer] residency Income information (from [place], W-2 forms, tax returns  or business records)   If you haven't already, sign up for My Chart to have easy access to your labs results, and communication with your primary care physician.  We are checking some labs today. If they are abnormal, I will call you. If they are normal, I will send you a MyChart message (if it is active) or a letter in the mail. If you do not hear about your labs in the next 2 weeks, please call the office. I recommend that you always bring your medications to each appointment as this makes it easy to ensure you are on the correct medications and helps [place] not miss refills when you need them. Call the clinic at [phone] if your symptoms worsen or you have any concerns.  You should return to our clinic No follow-ups on file. Please arrive 15 minutes before your appointment to ensure smooth check in process.  We appreciate your efforts in making this happen.  Thank you for allowing me to participate in your care, Alfredo Martinez, MD 04/25/2023, 4:00 PM PGY-2, [facility]

## 2023-04-25 NOTE — Progress Notes (Signed)
  SUBJECTIVE:   CHIEF COMPLAINT / HPI:   Type 2 Diabetes: Home medications include: Trulicity 3mg  weekly--- A1C from 12/2022 11.3%. Does endorse compliance. Home glucose monitoring is performed sporadically. Last check 200s. Was out of the Trulicity for awhile and started it 4 weeks ago.   Most recent A1Cs:  Lab Results  Component Value Date   HGBA1C 10.3 (A) 04/25/2023   HGBA1C 8.2 (A) 06/23/2022   Last Microalbumin, LDL, Creatinine: Lab Results  Component Value Date   MICROALBUR 150 03/04/2020   LDLCALC 87 06/23/2022   CREATININE 3.05 (H) 06/23/2022    Patient is up to date on diabetic eye. Patient is up to date on diabetic foot exam.  Hypertension: BP: (!) 164/88 today. Home medications include: lisinopril-HCTZ 20-25 mg daily, amlodipine 5 mg daily, Lasix 20 mg, labetolol 100 mg BID. He endorses taking these medications as prescribed. Does not check blood pressure at home.  Most recent creatinine trend:  Lab Results  Component Value Date   CREATININE 3.05 (H) 06/23/2022   CREATININE 3.32 (H) 03/05/2022   CREATININE 2.70 (H) 11/17/2021   Patient has had a BMP in the past 1 year. Follows with Nephrology, Dr. Arrie Aran.   HLD: Last LDL 87, on statin- lipitor 40 mg   PERTINENT  PMH / PSH:   HLD HTN DM2 Obesity  Patient Care Team: Alfredo Martinez, MD as PCP - General (Family Medicine) OBJECTIVE:  BP (!) 164/88   Pulse 73   Ht 5\' 9"  (1.753 m)   Wt 255 lb 6.4 oz (115.8 kg)   SpO2 99%   BMI 37.72 kg/m  Physical Exam  General: Alert and oriented in no apparent distress Heart: Regular rate and rhythm with no murmurs appreciated Lungs: CTA bilaterally, no wheezing Abdomen: Bowel sounds present, no abdominal pain Skin: Warm and dry Extremities: No lower extremity edema   ASSESSMENT/PLAN:  Diabetes mellitus without complication (HCC) -     POCT glycosylated hemoglobin (Hb A1C) -     HumuLIN 70/30 KwikPen; Inject 4-6 Units into the skin in the morning and at  bedtime. Inject 6 units in the morning before breakfast and 4 units in afternoon before dinner  Dispense: 15 mL; Refill: 3  HYPERTENSION, BENIGN ESSENTIAL Assessment & Plan: BP: (!) 164/88 today. Poorly controlled.  Continue to work on healthy dietary habits and exercise. Follow up in 3 weeks.   Medication regimen:  lisinopril-HCTZ 20-25 mg daily, amlodipine 5 mg daily, Lasix 20 mg, increased labetolol to 200 mg BID   Orders: -     Labetalol HCl; Take 1 tablet (200 mg total) by mouth 2 (two) times daily.  Dispense: 60 tablet; Refill: 1  Uncontrolled type 2 diabetes mellitus with hyperglycemia (HCC) Assessment & Plan: Trulicity 4.5 weekly through lilly cares  Needs insulin, started humilin 70/30 BID 6 U in AM and 4 U in PM with sugar checks  Provided with lilly cares coupon as well for insulin Will send to Dr. Raymondo Band and route to [facility] for [employer] application assistance.      Lack of insurance is a large barrier to his care. Discussed the need for Medicaid and provided with information to apply.    Alfredo Martinez, MD 04/25/2023, 4:22 PM PGY-2, [facility]

## 2023-04-25 NOTE — Assessment & Plan Note (Signed)
BP: (!) 164/88 today. Poorly controlled.  Continue to work on healthy dietary habits and exercise. Follow up in 3 weeks.   Medication regimen:  lisinopril-HCTZ 20-25 mg daily, amlodipine 5 mg daily, Lasix 20 mg, increased labetolol to 200 mg BID

## 2023-04-25 NOTE — Assessment & Plan Note (Signed)
Trulicity 4.5 weekly through lilly cares  Needs insulin, started humilin 70/30 BID 6 U in AM and 4 U in PM with sugar checks  Provided with lilly cares coupon as well for insulin Will send to Dr. Raymondo Band and route to [facility] for [employer] application assistance.

## 2023-04-26 ENCOUNTER — Other Ambulatory Visit (HOSPITAL_COMMUNITY): Payer: Self-pay

## 2023-04-27 ENCOUNTER — Telehealth: Payer: Self-pay

## 2023-04-27 NOTE — Telephone Encounter (Signed)
-----   Message from Alfredo Martinez, MD sent at 04/26/2023  6:56 PM EDT ----- Can we schedule this patient a follow up with doctor koval for medication assistance for DM?  Thanks  ----- Message ----- From: Otho Najjar, CPhT Sent: 04/26/2023   2:38 PM EDT To: Alfredo Martinez, MD  I have spoken with this patient multiple times this year regarding his [employer] re-enrollment (his enrollment ended 12/11/22). He needs to apply for medicaid in the meantime and let me know once he's approved or denied. He has even confirmed he's going to do so, but still hasn't!! ----- Message ----- From: Alfredo Martinez, MD Sent: 04/25/2023   4:23 PM EDT To: Kathrin Ruddy, RPH-CPP; #  Dianna Limbo, this patient is in the [employer] and receives his trulicity through this. I need to send in Trulicity 4.5, also needs insulin.

## 2023-04-27 NOTE — Telephone Encounter (Signed)
Attempted to reach patient. No answer. LVM for patient to call the office to make appt with Dr. Raymondo Band for medication review. Aquilla Solian, CMA

## 2023-05-15 ENCOUNTER — Other Ambulatory Visit: Payer: Self-pay | Admitting: Student

## 2023-05-15 DIAGNOSIS — I10 Essential (primary) hypertension: Secondary | ICD-10-CM

## 2023-05-22 ENCOUNTER — Encounter: Payer: Self-pay | Admitting: Family Medicine

## 2023-05-22 ENCOUNTER — Ambulatory Visit (INDEPENDENT_AMBULATORY_CARE_PROVIDER_SITE_OTHER): Payer: Self-pay | Admitting: Family Medicine

## 2023-05-22 VITALS — BP 145/85 | HR 77 | Ht 69.0 in | Wt 251.0 lb

## 2023-05-22 DIAGNOSIS — Z5989 Other problems related to housing and economic circumstances: Secondary | ICD-10-CM

## 2023-05-22 DIAGNOSIS — I10 Essential (primary) hypertension: Secondary | ICD-10-CM

## 2023-05-22 DIAGNOSIS — H539 Unspecified visual disturbance: Secondary | ICD-10-CM | POA: Insufficient documentation

## 2023-05-22 DIAGNOSIS — E083599 Diabetes mellitus due to underlying condition with proliferative diabetic retinopathy without macular edema, unspecified eye: Secondary | ICD-10-CM | POA: Insufficient documentation

## 2023-05-22 NOTE — Assessment & Plan Note (Signed)
Patient with T2DM on insulin and HTN that presents with acute onset of vision changes in the right eye.  Both eyes on unilateral screening are 20/100 vision, and unclear if this is part of the acute picture this is more chronic with diabetic retinopathy.  Given the acute complaints of less than 2 days ago now having vision change in the right eye, I called on-call ophthalmology and discussed the case with Dr. Vonna Kotyk.  Patient scheduled for visit in ophthalmology office for today at 3 PM for acute evaluation.

## 2023-05-22 NOTE — Progress Notes (Signed)
    SUBJECTIVE:   CHIEF COMPLAINT / HPI:   Vision changes - woke up 2 days ago with acute vision changes - currently has a black appearing floating object in his right eye that is bothering him - sometimes has pain with sunlight but otherwise is not painful - most recent eye changes are mainly in his right eye - he does not seen an ophthalmologist though he has diabetes and HTN  PERTINENT  PMH / PSH: T2DM on insulin, HTN  OBJECTIVE:   BP (!) 145/85   Pulse 77   Ht 5\' 9"  (1.753 m)   Wt 251 lb (113.9 kg)   SpO2 99%   BMI 37.07 kg/m   General: NAD, well-appearing, well-nourished Respiratory: No respiratory distress, breathing comfortably, able to speak in full sentences Skin: warm and dry, no rashes noted on exposed skin Psych: Appropriate affect and mood HEENT: no obvious deformity on examination, slightly decreased peripheral vision Vision Screening   Right eye Left eye Both eyes  Without correction 20/100 20/100 20/70  With correction       ASSESSMENT/PLAN:   Vision changes Patient with T2DM on insulin and HTN that presents with acute onset of vision changes in the right eye.  Both eyes on unilateral screening are 20/100 vision, and unclear if this is part of the acute picture this is more chronic with diabetic retinopathy.  Given the acute complaints of less than 2 days ago now having vision change in the right eye, I called on-call ophthalmology and discussed the case with Dr. Vonna Kotyk.  Patient scheduled for visit in ophthalmology office for today at 3 PM for acute evaluation.  HYPERTENSION, BENIGN ESSENTIAL BP elevated in office at 145/85 today.  Currently on lisinopril-HCTZ 20-25 mg, amlodipine 5 mg, Lasix 20 mg, labetalol 200 mg twice daily.  Patient to follow-up with PCP     Evelena Leyden, DO [facility]

## 2023-05-22 NOTE — Patient Instructions (Addendum)
[employer] [address], [place] at [employer] [facility]

## 2023-05-22 NOTE — Assessment & Plan Note (Signed)
BP elevated in office at 145/85 today.  Currently on lisinopril-HCTZ 20-25 mg, amlodipine 5 mg, Lasix 20 mg, labetalol 200 mg twice daily.  Patient to follow-up with PCP

## 2023-06-05 ENCOUNTER — Telehealth: Payer: Self-pay

## 2023-06-05 DIAGNOSIS — E118 Type 2 diabetes mellitus with unspecified complications: Secondary | ICD-10-CM

## 2023-06-05 MED ORDER — TRULICITY 3 MG/0.5ML ~~LOC~~ SOAJ
3.0000 mg | SUBCUTANEOUS | 3 refills | Status: DC
Start: 2023-06-05 — End: 2023-06-24

## 2023-06-05 NOTE — Telephone Encounter (Signed)
Pt called to inform me he got approved for medicaid. (Woohoo!)  He would like for his trulicity to be sent to [facility] at [employer].  Pt is aware he needs to take the approval letter with him to pharmacy to show them his medicaid ID number.

## 2023-06-23 ENCOUNTER — Telehealth: Payer: Self-pay

## 2023-06-23 NOTE — Telephone Encounter (Signed)
Patient calls nurse line in regards to Trulicity.   He reports the pharmacy does not have stock of 3mg .   He reports they have stock of 1.5mg . He reports he would rather be on this dose than none at all.   Will forward to PCP.

## 2023-06-24 ENCOUNTER — Other Ambulatory Visit: Payer: Self-pay | Admitting: Student

## 2023-06-24 DIAGNOSIS — E118 Type 2 diabetes mellitus with unspecified complications: Secondary | ICD-10-CM

## 2023-06-24 MED ORDER — TRULICITY 3 MG/0.5ML ~~LOC~~ SOAJ
1.5000 mg | SUBCUTANEOUS | 0 refills | Status: DC
Start: 2023-06-24 — End: 2023-06-27

## 2023-06-26 NOTE — Telephone Encounter (Signed)
Please resend for 1.5mg  pens.   [facility] reports they can not use the prescription 3mg  taking 1.5mg .   Will forward to PCP.

## 2023-06-27 MED ORDER — TRULICITY 1.5 MG/0.5ML ~~LOC~~ SOAJ: 1.5000 mg | SUBCUTANEOUS | 3 refills | Status: DC

## 2023-06-27 NOTE — Addendum Note (Signed)
Addended by: Alfredo Martinez on: 06/27/2023 12:39 PM   Modules accepted: Orders

## 2023-06-28 NOTE — Telephone Encounter (Signed)
Patient calls nurse line reporting Trulicity is needing a prior authorization.   He reports he gave [facility] his medicaid information, however he was still unable to get his medication.   Called pharmacy and PA is required.   Will forward to pharmacy.

## 2023-06-29 NOTE — Telephone Encounter (Signed)
Pharmacy Patient Advocate Encounter   Received notification from Pt Calls Messages that prior authorization for TRULICITY 1.5MG  is required/requested.  PA submitted to [facility] Medicaid via CoverMyMeds Key/confirmation #/EOC [id number] Status is pending

## 2023-06-29 NOTE — Telephone Encounter (Signed)
Patient updated.

## 2023-06-29 NOTE — Telephone Encounter (Signed)
Pharmacy Patient Advocate Encounter  Received notification from [facility] [employer] that Prior Authorization for TRULICITY 1.5MG  has been APPROVED from 06/29/23 to 06/28/24.Marland Kitchen  PA #/Case ID/Reference #: [id number]

## 2023-06-29 NOTE — Telephone Encounter (Signed)
Pharmacy has been updated on approval.   Attempted to call patient to make aware, however no answer. VM left to return my call.

## 2023-07-21 ENCOUNTER — Other Ambulatory Visit: Payer: Self-pay | Admitting: Student

## 2023-07-21 DIAGNOSIS — I10 Essential (primary) hypertension: Secondary | ICD-10-CM

## 2023-07-25 ENCOUNTER — Other Ambulatory Visit: Payer: Self-pay | Admitting: Student

## 2023-07-25 DIAGNOSIS — I10 Essential (primary) hypertension: Secondary | ICD-10-CM

## 2023-08-24 ENCOUNTER — Other Ambulatory Visit: Payer: Self-pay | Admitting: Student

## 2023-08-24 DIAGNOSIS — I10 Essential (primary) hypertension: Secondary | ICD-10-CM

## 2023-09-08 ENCOUNTER — Telehealth: Payer: Self-pay

## 2023-09-08 NOTE — Telephone Encounter (Signed)
Patient LVM on nurse line requesting to speak with Durward Mallard regarding questions with medication assistance.   Please return call to patient at [phone].  Veronda Prude, RN

## 2023-09-11 NOTE — Telephone Encounter (Signed)
Returned patients call.   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 Advised patient to reach out to the front desk for further assistance with this.

## 2023-09-20 ENCOUNTER — Other Ambulatory Visit: Payer: Self-pay | Admitting: Student

## 2023-09-20 DIAGNOSIS — I10 Essential (primary) hypertension: Secondary | ICD-10-CM

## 2023-09-21 NOTE — Telephone Encounter (Signed)
Needs follow up before next refill

## 2023-10-22 ENCOUNTER — Other Ambulatory Visit: Payer: Self-pay | Admitting: Student

## 2023-10-22 DIAGNOSIS — I10 Essential (primary) hypertension: Secondary | ICD-10-CM

## 2023-11-06 IMAGING — CT CT CHEST-ABD-PELV W/O CM
2 of 4 series · 14 of 36 positions shown, 16 images · non-contrast
Comparison: CT chest dated 01/24/2019.

CLINICAL DATA: Pain to the right ribs. Incident of
dizziness/lightheadedness and vomiting yesterday.



[Series 2: axial st · axial · 0.91mm/px · z∈[-400,+156]mm · 11 of 135 slices shown, 13 images]
[im 12/135  mediastinal]
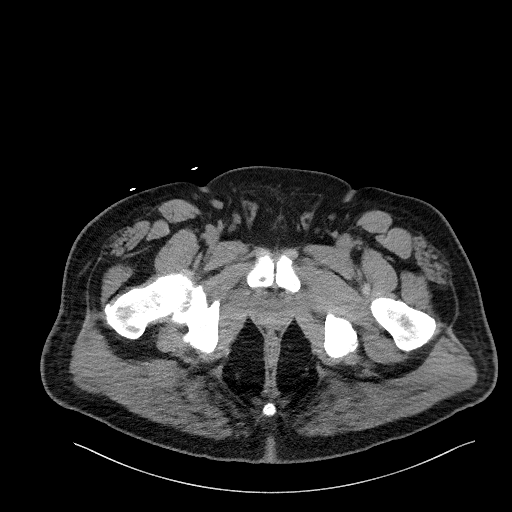
[im 12/135  bone]
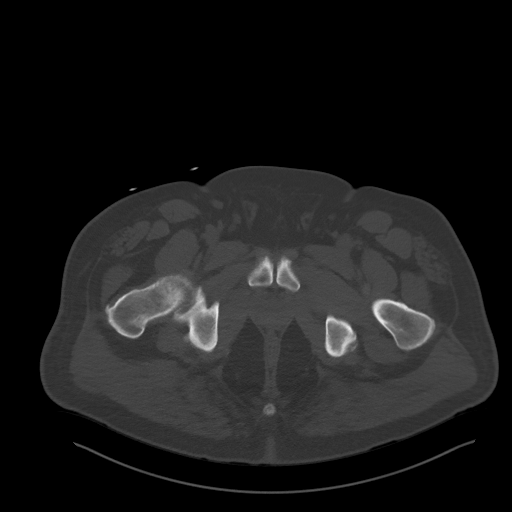
[im 23/135  mediastinal]
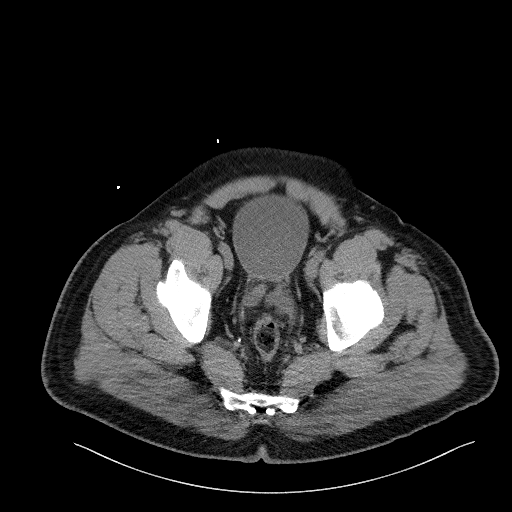
[im 34/135  mediastinal]
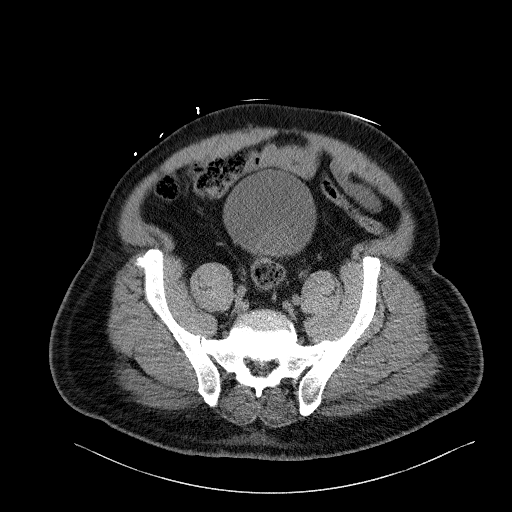
[im 45/135  mediastinal]
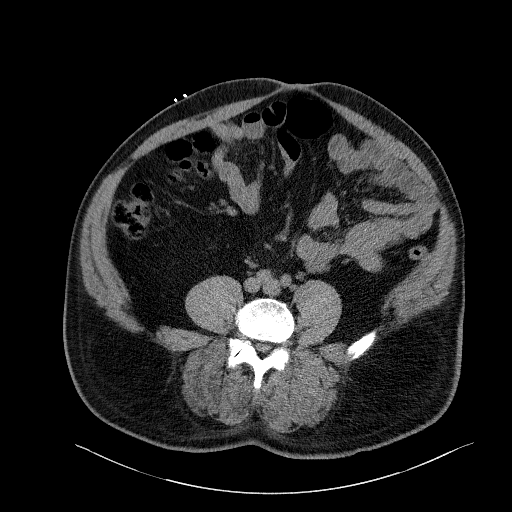
[im 56/135  mediastinal]
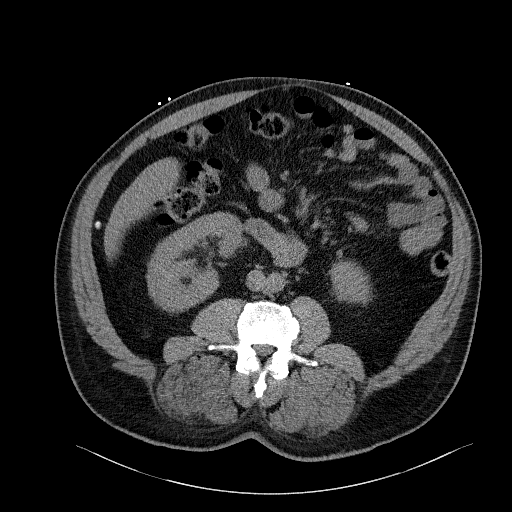
[im 68/135  mediastinal]
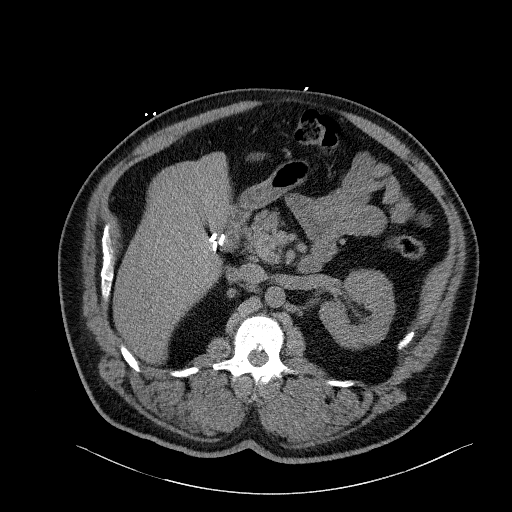
[im 79/135  mediastinal]
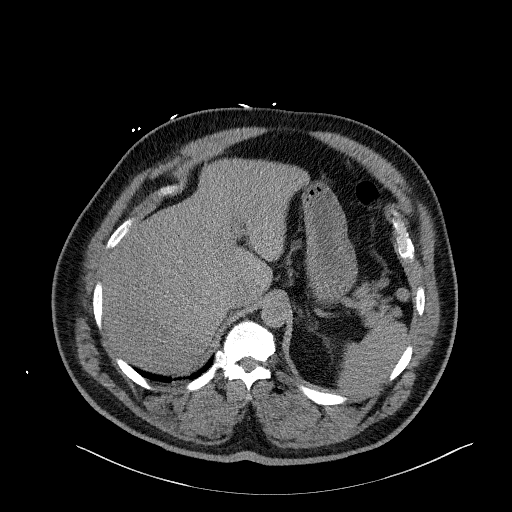
[im 90/135  mediastinal]
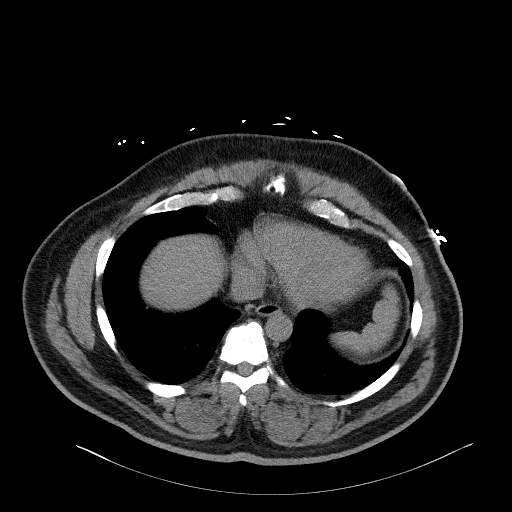
[im 101/135  mediastinal]
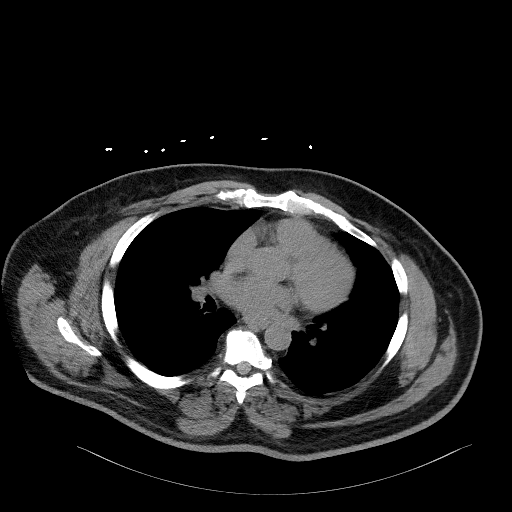
[im 101/135  bone]
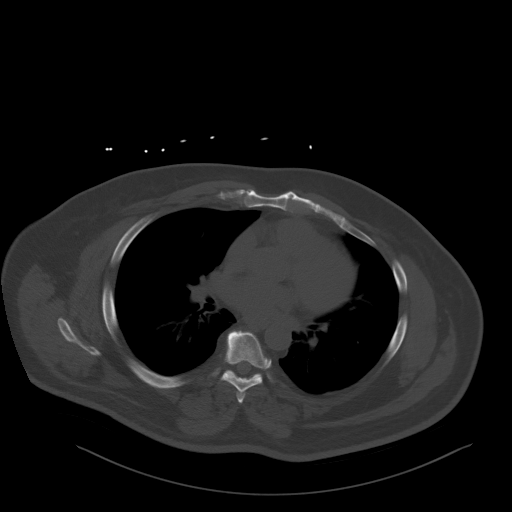
[im 112/135  mediastinal]
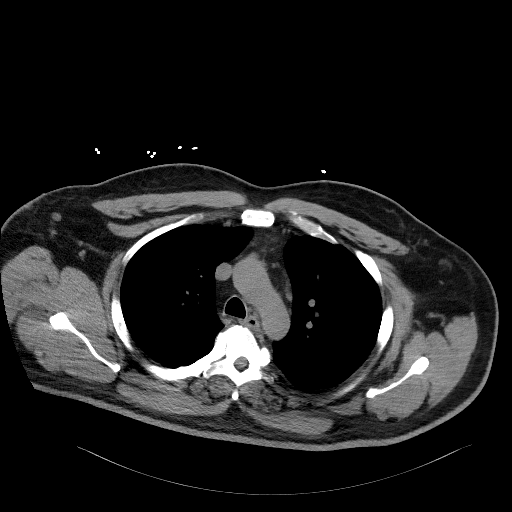
[im 123/135  mediastinal]
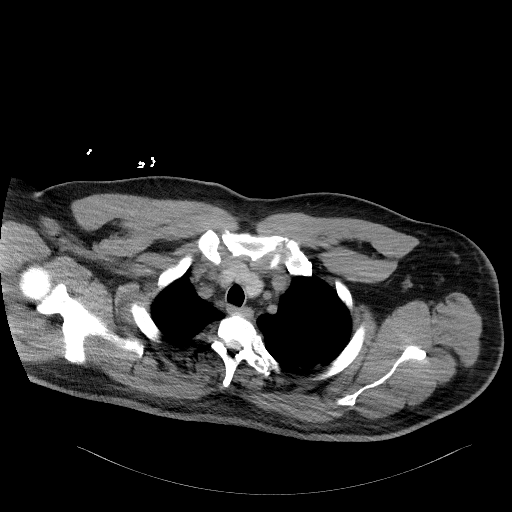

[Series 5: coronal · coronal · 0.97mm/px · 3 of 176 slices shown]
[im 36/176  mediastinal]
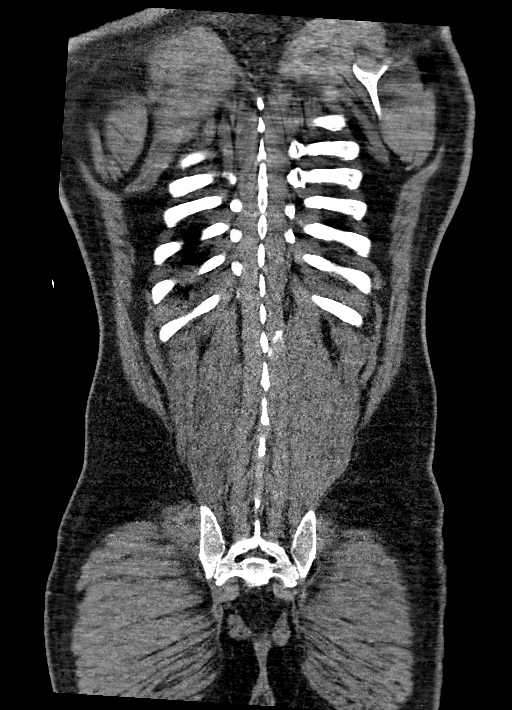
[im 71/176  mediastinal]
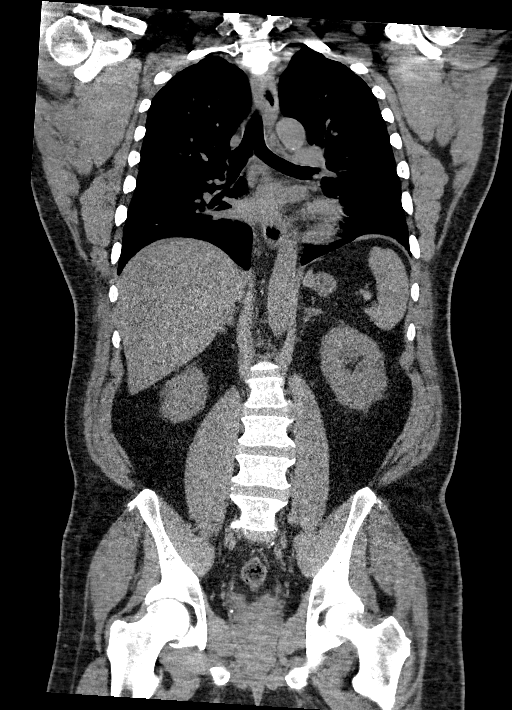
[im 106/176  mediastinal]
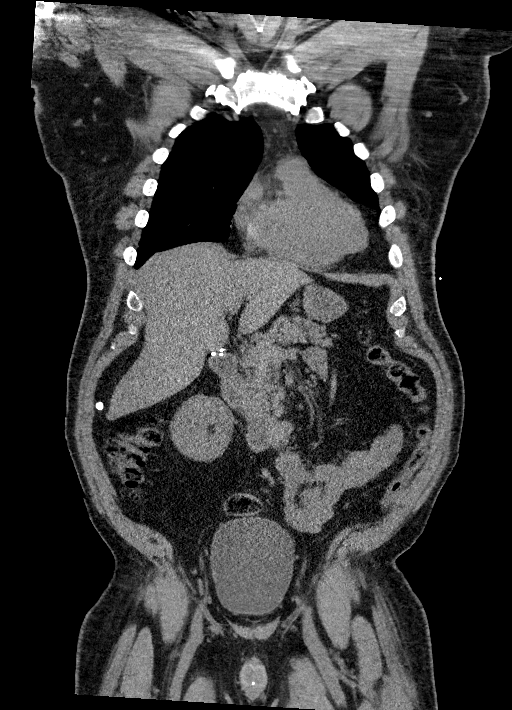

[14 of 36 positions shown; findings below may reference images not displayed]

FINDINGS: CT CHEST FINDINGS

Cardiovascular: No significant vascular findings. Normal heart size.
No pericardial effusion.

Mediastinum/Nodes: No enlarged mediastinal, hilar, or axillary lymph
nodes. Thyroid gland, trachea, and esophagus demonstrate no
significant findings.

Lungs/Pleura: There is minimal bilateral dependent atelectasis. No
pleural effusion or pneumothorax.

Musculoskeletal: Degenerative changes are seen in the spine.

CT ABDOMEN PELVIS FINDINGS

Hepatobiliary: No focal liver abnormality is seen. Status post
cholecystectomy. No biliary dilatation.

Pancreas: Unremarkable. No pancreatic ductal dilatation or
surrounding inflammatory changes.

Spleen: Normal in size without focal abnormality.

Adrenals/Urinary Tract: Adrenal glands are unremarkable. Kidneys are
normal, without renal calculi, focal lesion, or hydronephrosis.
Bladder is unremarkable.

Stomach/Bowel: Stomach is within normal limits. Appendix appears
normal. No evidence of bowel wall thickening, distention, or
inflammatory changes.

Vascular/Lymphatic: Aortic atherosclerosis. No enlarged abdominal or
pelvic lymph nodes.

Reproductive: Prostate is unremarkable.

Other: No abdominal wall hernia or abnormality. No abdominopelvic
ascites.

Musculoskeletal: Degenerative changes are seen in the spine.
IMPRESSION: No findings to explain the patient's symptoms.

## 2023-11-20 ENCOUNTER — Emergency Department (HOSPITAL_BASED_OUTPATIENT_CLINIC_OR_DEPARTMENT_OTHER)
Admission: EM | Admit: 2023-11-20 | Discharge: 2023-11-20 | Disposition: A | Discharge status: Home / Self Care | Payer: Medicaid Other

## 2023-11-20 ENCOUNTER — Other Ambulatory Visit: Payer: Self-pay

## 2023-11-20 ENCOUNTER — Encounter (HOSPITAL_BASED_OUTPATIENT_CLINIC_OR_DEPARTMENT_OTHER): Payer: Self-pay

## 2023-11-20 DIAGNOSIS — I10 Essential (primary) hypertension: Secondary | ICD-10-CM | POA: Diagnosis not present

## 2023-11-20 DIAGNOSIS — E119 Type 2 diabetes mellitus without complications: Secondary | ICD-10-CM | POA: Insufficient documentation

## 2023-11-20 DIAGNOSIS — M62830 Muscle spasm of back: Secondary | ICD-10-CM | POA: Diagnosis not present

## 2023-11-20 DIAGNOSIS — Z79899 Other long term (current) drug therapy: Secondary | ICD-10-CM | POA: Diagnosis not present

## 2023-11-20 DIAGNOSIS — Z7984 Long term (current) use of oral hypoglycemic drugs: Secondary | ICD-10-CM | POA: Diagnosis not present

## 2023-11-20 DIAGNOSIS — Z794 Long term (current) use of insulin: Secondary | ICD-10-CM | POA: Insufficient documentation

## 2023-11-20 DIAGNOSIS — Z7982 Long term (current) use of aspirin: Secondary | ICD-10-CM | POA: Diagnosis not present

## 2023-11-20 DIAGNOSIS — M549 Dorsalgia, unspecified: Secondary | ICD-10-CM

## 2023-11-20 LAB — URINALYSIS, ROUTINE W REFLEX MICROSCOPIC
Bacteria, UA: NONE SEEN
Bilirubin Urine: NEGATIVE
Glucose, UA: >1000 mg/dL — AB
Ketones, ur: NEGATIVE mg/dL
Leukocytes,Ua: NEGATIVE
Nitrite: NEGATIVE
Protein, ur: >300 mg/dL — AB
Specific Gravity, Urine: 1.013 (ref 1.005–1.030)
pH: 6 (ref 5.0–8.0)

## 2023-11-20 MED ORDER — LIDOCAINE 5 % EX PTCH
1.0000 | MEDICATED_PATCH | CUTANEOUS | Status: DC
  Administered 2023-11-20: 1 via TRANSDERMAL
  Filled 2023-11-20: qty 1

## 2023-11-20 MED ORDER — METHOCARBAMOL 500 MG PO TABS: 1000.0000 mg | ORAL_TABLET | Freq: Three times a day (TID) | ORAL | 0 refills | Status: DC

## 2023-11-20 MED ORDER — METHOCARBAMOL 500 MG PO TABS
1000.0000 mg | ORAL_TABLET | Freq: Once | ORAL | Status: AC
  Administered 2023-11-20: 1000 mg via ORAL
  Filled 2023-11-20: qty 2

## 2023-11-20 NOTE — Discharge Instructions (Addendum)
Thank you for allowing [place] to be a part of your care today.  You were evaluated in the ED for back pain.  Your urine does not show evidence of infection.  I suspect your symptoms are musculoskeletal.    I have sent a prescription for methocarbamol to your pharmacy to help with muscle spasms.  Take as prescribed.  Avoid driving, operating heavy machinery, or performing other tasks that require you to be completely alert while taking this medication as it is often sedating.   Pick up lidocaine patches over-the-counter at the pharmacy.  If you are unsure where to get these, ask the pharmacist.    Follow up with your primary care doctor.   Return to the ED if you develop sudden worsening of your symptoms or if you have new concerns.

## 2023-11-20 NOTE — ED Triage Notes (Signed)
In for eval of right back and right side pain onset 1 week ago. Denies SOB, N/V. or cough. Took Tylenol OTC without relief. Denies injury.

## 2023-11-20 NOTE — ED Provider Notes (Signed)
[place] EMERGENCY DEPARTMENT AT [facility] Provider Note   CSN: [id number] Arrival date & time: 11/20/23  0840     History  Chief Complaint  Patient presents with   Back Pain    Patrick [name] is a 52 y.o. male with past medical history significant for diabetes, hyperlipidemia, hypertension, obesity presents to the ED complaining of right back and ride side pain that began 1 week ago.  He states it is worse when he gets up and changes positions.  He describes it as a "locking up" feeling, like a muscle spasm on the right side.  States it is also painful to try and lay or sleep on his right side.  Patient took Tylenol without relief.  He has not had prior back surgeries.  Denies nausea, vomiting, abdominal pain, fever, urinary symptoms, numbness or weakness in the extremities, difficulty walking, known injury or heavy lifting.         Home Medications Prior to Admission medications   Medication Sig Start Date End Date Taking? Authorizing Provider  amLODipine (NORVASC) 5 MG tablet TAKE 1 TABLET BY MOUTH AT BEDTIME 10/23/23  Yes Alfredo Martinez, MD  aspirin EC 81 MG tablet Take 1 tablet (81 mg total) by mouth daily. 10/31/14  Yes Leona Singleton, MD  atorvastatin (LIPITOR) 40 MG tablet Take 1 tablet (40 mg total) by mouth daily. 06/28/22  Yes Alfredo Martinez, MD  dapagliflozin propanediol (FARXIGA) 10 MG TABS tablet Take 1 tablet (10 mg total) by mouth daily. 11/17/21  Yes Brimage, Vondra, DO  Dulaglutide (TRULICITY) 1.5 MG/0.5ML SOPN Inject 1.5 mg into the skin once a week. 06/27/23  Yes Alfredo Martinez, MD  furosemide (LASIX) 20 MG tablet Take 20 mg by mouth.   Yes [provider]  gabapentin (NEURONTIN) 300 MG capsule Take 1 capsule by mouth at bedtime 10/23/23  Yes Maxwell, Allee, MD  glucose blood (WAVESENSE PRESTO) test strip Use as instructed - Dispense QS for up to twice daily testing. 01/02/14  Yes Hensel, Santiago Bumpers, MD  labetalol (NORMODYNE) 200 MG tablet  Take 1 tablet (200 mg total) by mouth 2 (two) times daily. 04/25/23  Yes Alfredo Martinez, MD  Lancets 30G MISC 1 each by Does not apply route 3 (three) times daily. 11/02/18  Yes Caro Laroche, DO  lisinopril-hydrochlorothiazide (ZESTORETIC) 20-25 MG tablet Take 1 tablet by mouth once daily 10/23/23  Yes Maxwell, Allee, MD  methocarbamol (ROBAXIN) 500 MG tablet Take 2 tablets (1,000 mg total) by mouth 3 (three) times daily. 11/20/23  Yes Ashad Fawbush R, PA-C      Allergies    Patient has no known allergies.    Review of Systems   Review of Systems  Constitutional:  Negative for fever.  Gastrointestinal:  Negative for abdominal pain, nausea and vomiting.  Genitourinary:  Negative for dysuria, flank pain, frequency, hematuria and urgency.  Musculoskeletal:  Positive for back pain. Negative for gait problem.  Neurological:  Negative for weakness and numbness.    Physical Exam Updated Vital Signs BP 116/83   Pulse 75   Temp 98.3 F (36.8 C) (Oral)   Resp 16   Ht 5\' 9"  (1.753 m)   Wt 113.4 kg   SpO2 98%   BMI 36.92 kg/m  Physical Exam Vitals and nursing note reviewed.  Constitutional:      General: He is not in acute distress.    Appearance: Normal appearance. He is not ill-appearing or diaphoretic.  Cardiovascular:     Rate and  Rhythm: Normal rate and regular rhythm.  Pulmonary:     Effort: Pulmonary effort is normal.  Musculoskeletal:     Thoracic back: Tenderness present. No spasms or bony tenderness.     Lumbar back: Tenderness present. No deformity, spasms or bony tenderness. Normal range of motion.       Back:     Comments: No midline tenderness to the thoracic or lumbar spine.  Skin:    General: Skin is warm and dry.     Capillary Refill: Capillary refill takes less than 2 seconds.  Neurological:     Mental Status: He is alert. Mental status is at baseline.     Gait: Gait is intact.     Comments: Patient is moving all extremities appropriately.  Psychiatric:         Mood and Affect: Mood normal.        Behavior: Behavior normal.     ED Results / Procedures / Treatments   Labs (all labs ordered are listed, but only abnormal results are displayed) Labs Reviewed  URINALYSIS, ROUTINE W REFLEX MICROSCOPIC - Abnormal; Notable for the following components:      Result Value   Glucose, UA >1,000 (*)    Hgb urine dipstick SMALL (*)    Protein, ur >300 (*)    All other components within normal limits    EKG None  Radiology No results found.  Procedures Procedures    Medications Ordered in ED Medications  lidocaine (LIDODERM) 5 % 1 patch (1 patch Transdermal Patch Applied 11/20/23 0924)  methocarbamol (ROBAXIN) tablet 1,000 mg (1,000 mg Oral Given 11/20/23 9604)    ED Course/ Medical Decision Making/ A&P                                 Medical Decision Making Amount and/or Complexity of Data Reviewed Labs: ordered.  Risk Prescription drug management.   This patient presents to the ED with chief complaint(s) of right sided back pain with pertinent past medical history of hypertension, diabetes, hyperlipidemia.  The complaint involves an extensive differential diagnosis and also carries with it a high risk of complications and morbidity.    The differential diagnosis includes muscle spasm, UTI, pyelonephritis, kidney stone   The initial plan is to obtain UA  Initial Assessment:   Exam significant for overall well-appearing patient who is not in acute distress.  Vitals are stable and he is afebrile.  He is moving all extremities appropriately.  Right sided paraspinal tenderness to large muscles in the thoracic and lumbar spine.  Patient has increased pain when changing positions such as sitting up or standing.  No palpable muscle spasms.  No midline tenderness or bony abnormalities.    Independent ECG/labs interpretation:  The following labs were independently interpreted:  UA without evidence of infection.  No large amount of Hgb.    Treatment and Reassessment: Patient given 1000 mg methocarbamol and lidocaine patch with improvement in his symptoms.    Disposition:   Suspect patient's symptoms are musculoskeletal in nature.  He had improvement with muscle relaxant and lidocaine patch.  Advised patient to schedule follow up with his PCP.  Robaxin prescription sent to pharmacy.    The patient has been appropriately medically screened and/or stabilized in the ED. I have low suspicion for any other emergent medical condition which would require further screening, evaluation or treatment in the ED or require inpatient management. At time of  discharge the patient is hemodynamically stable and in no acute distress. I have discussed work-up results and diagnosis with patient and answered all questions. Patient is agreeable with discharge plan. We discussed strict return precautions for returning to the emergency department and they verbalized understanding.           Final Clinical Impression(s) / ED Diagnoses Final diagnoses:  Spasm of back muscles  Acute right-sided back pain, unspecified back location    Rx / DC Orders ED Discharge Orders          Ordered    methocarbamol (ROBAXIN) 500 MG tablet  3 times daily        11/20/23 1029              Melton Alar R, PA-C 11/20/23 1029    Coral Spikes, DO 11/20/23 1559

## 2024-01-23 ENCOUNTER — Other Ambulatory Visit: Payer: Self-pay | Admitting: Student

## 2024-01-23 DIAGNOSIS — I10 Essential (primary) hypertension: Secondary | ICD-10-CM

## 2024-02-13 ENCOUNTER — Other Ambulatory Visit: Payer: Self-pay | Admitting: Student

## 2024-02-13 DIAGNOSIS — I10 Essential (primary) hypertension: Secondary | ICD-10-CM

## 2024-02-20 ENCOUNTER — Other Ambulatory Visit: Payer: Self-pay | Admitting: Student

## 2024-02-20 DIAGNOSIS — I10 Essential (primary) hypertension: Secondary | ICD-10-CM

## 2024-03-19 ENCOUNTER — Other Ambulatory Visit: Payer: Self-pay | Admitting: Student

## 2024-03-19 DIAGNOSIS — I10 Essential (primary) hypertension: Secondary | ICD-10-CM

## 2024-04-20 ENCOUNTER — Other Ambulatory Visit: Payer: Self-pay | Admitting: Student

## 2024-04-20 DIAGNOSIS — I10 Essential (primary) hypertension: Secondary | ICD-10-CM

## 2024-05-23 ENCOUNTER — Other Ambulatory Visit: Payer: Self-pay | Admitting: Student

## 2024-05-23 DIAGNOSIS — I10 Essential (primary) hypertension: Secondary | ICD-10-CM

## 2024-07-04 ENCOUNTER — Other Ambulatory Visit: Payer: Self-pay

## 2024-07-04 DIAGNOSIS — I10 Essential (primary) hypertension: Secondary | ICD-10-CM

## 2024-07-04 MED ORDER — AMLODIPINE BESYLATE 5 MG PO TABS: 5.0000 mg | ORAL_TABLET | Freq: Every day | ORAL | 1 refills | Status: AC

## 2024-07-04 NOTE — Telephone Encounter (Signed)
Pt chart reviewed

## 2024-07-08 ENCOUNTER — Other Ambulatory Visit: Payer: Self-pay

## 2024-07-08 MED ORDER — TRULICITY 3 MG/0.5ML ~~LOC~~ SOAJ: 3.0000 mg | SUBCUTANEOUS | 0 refills | Status: DC

## 2024-07-08 NOTE — Telephone Encounter (Signed)
 Chart reviewed, appt scheduled 07/12/24

## 2024-07-08 NOTE — Telephone Encounter (Signed)
 Patient calls nurse line requesting refill on Trulicity .   He reports that he has been injecting 3 mg weekly. Per chart review, active rx was for 1.5 mg weekly. Patient reports that he was only taking this dosage when pharmacy was out of 3 mg pen. Since this has been back in stock, he has been taking the 3 mg pen for several months.   Pended 3 mg pen to refill request. Scheduled patient appointment with Dr. Nygaard on 07/12/24 for annual exam and med follow up.   Chiquita JAYSON English, RN

## 2024-07-12 ENCOUNTER — Ambulatory Visit

## 2024-07-15 ENCOUNTER — Other Ambulatory Visit: Payer: Self-pay

## 2024-07-15 ENCOUNTER — Ambulatory Visit

## 2024-07-15 DIAGNOSIS — I10 Essential (primary) hypertension: Secondary | ICD-10-CM

## 2024-07-15 MED ORDER — LISINOPRIL-HYDROCHLOROTHIAZIDE 20-25 MG PO TABS: 1.0000 | ORAL_TABLET | Freq: Every day | ORAL | 0 refills | Status: DC

## 2024-07-18 ENCOUNTER — Other Ambulatory Visit: Payer: Self-pay

## 2024-07-18 MED ORDER — GABAPENTIN 300 MG PO CAPS: 300.0000 mg | ORAL_CAPSULE | Freq: Every day | ORAL | 0 refills | Status: AC

## 2024-07-18 NOTE — Telephone Encounter (Signed)
 Spoke with patient. Made appt for future refills on 8/14. Patient asked if he could get a partial of the medication Gabapentin  or does he just have to wait until his appt? Nelson Land, CMA

## 2024-07-18 NOTE — Telephone Encounter (Signed)
 Chart reviewed  -Fairy Amy, MD

## 2024-07-25 ENCOUNTER — Ambulatory Visit: Payer: Self-pay

## 2024-07-25 ENCOUNTER — Inpatient Hospital Stay (HOSPITAL_COMMUNITY): Admission: RE | Admit: 2024-07-25 | Source: Ambulatory Visit

## 2024-07-25 ENCOUNTER — Ambulatory Visit (INDEPENDENT_AMBULATORY_CARE_PROVIDER_SITE_OTHER)

## 2024-07-25 VITALS — BP 125/78 | HR 76 | Ht 69.0 in | Wt 243.0 lb

## 2024-07-25 DIAGNOSIS — N1832 Chronic kidney disease, stage 3b: Secondary | ICD-10-CM | POA: Diagnosis not present

## 2024-07-25 DIAGNOSIS — Z113 Encounter for screening for infections with a predominantly sexual mode of transmission: Secondary | ICD-10-CM

## 2024-07-25 DIAGNOSIS — Z23 Encounter for immunization: Secondary | ICD-10-CM | POA: Diagnosis not present

## 2024-07-25 DIAGNOSIS — I10 Essential (primary) hypertension: Secondary | ICD-10-CM | POA: Diagnosis not present

## 2024-07-25 DIAGNOSIS — E1122 Type 2 diabetes mellitus with diabetic chronic kidney disease: Secondary | ICD-10-CM

## 2024-07-25 DIAGNOSIS — Z0000 Encounter for general adult medical examination without abnormal findings: Secondary | ICD-10-CM

## 2024-07-25 DIAGNOSIS — Z1211 Encounter for screening for malignant neoplasm of colon: Secondary | ICD-10-CM

## 2024-07-25 DIAGNOSIS — E66812 Obesity, class 2: Secondary | ICD-10-CM | POA: Diagnosis not present

## 2024-07-25 DIAGNOSIS — R0683 Snoring: Secondary | ICD-10-CM

## 2024-07-25 DIAGNOSIS — E7800 Pure hypercholesterolemia, unspecified: Secondary | ICD-10-CM

## 2024-07-25 DIAGNOSIS — M7989 Other specified soft tissue disorders: Secondary | ICD-10-CM

## 2024-07-25 DIAGNOSIS — Z1159 Encounter for screening for other viral diseases: Secondary | ICD-10-CM

## 2024-07-25 DIAGNOSIS — Z125 Encounter for screening for malignant neoplasm of prostate: Secondary | ICD-10-CM

## 2024-07-25 DIAGNOSIS — M10072 Idiopathic gout, left ankle and foot: Secondary | ICD-10-CM | POA: Diagnosis not present

## 2024-07-25 LAB — POCT GLYCOSYLATED HEMOGLOBIN (HGB A1C): HbA1c, POC (controlled diabetic range): 7.4 % — AB (ref 0.0–7.0)

## 2024-07-25 MED ORDER — LANCETS 30G MISC: 1.0000 | Freq: Three times a day (TID) | 3 refills | Status: AC

## 2024-07-25 MED ORDER — ATORVASTATIN CALCIUM 40 MG PO TABS: 40.0000 mg | ORAL_TABLET | Freq: Every day | ORAL | 0 refills | Status: AC

## 2024-07-25 MED ORDER — PREDNISONE 20 MG PO TABS: 40.0000 mg | ORAL_TABLET | Freq: Every day | ORAL | 0 refills | Status: AC

## 2024-07-25 MED ORDER — GLUCOSE BLOOD VI STRP: ORAL_STRIP | 12 refills | Status: AC

## 2024-07-25 NOTE — Assessment & Plan Note (Signed)
 Controlled, A1c today 7.4 and pending ACR.  History of poorly controlled diabetes with insulin , doing better with dulaglutide .  Told not to do metformin  by nephrologist because of CKD.  Poor diet and exercise. -Diet and exercise counseling given will try this for next 3 months to see if we can get A1c under 7. - Continue dulaglutide  3 mg once a week - Refill diabetic supplies - Record fasting glucose and Premeal glucose

## 2024-07-25 NOTE — Progress Notes (Signed)
 SUBJECTIVE:   Chief compliant/HPI: annual examination  Patrick [name] is a 53 y.o. who presents today for an annual exam.   History tabs reviewed and updated.   L ankle: started 1 week ago, sudden onset, shooting lightning pain up leg for 1-2 days, swelling around the ankle, 10/10 dull achy pain when walking on it, little pain at rest, no trauma or falls prior to onset. No alcohol or food association prior to onset. No redness, fevers, n/v, chest pain, shortness of breath, constipation, diarrhea.  He does report that because of his kidney function his left leg tends to swell worse than his right leg.  T2DM: Previously on insulin , but stable on dulaglutide .  Eating fast food once a week, states that he really likes burgers and fries.  Not eating hardly any vegetables.  Doing no exercise, previously did weight lifting.  Reports it is hard to walk around his neighborhood because its full of pit bulls.  He did state that he does not have diabetic supplies to take his blood sugar at home.  Sleep: Reports that he snores loudly, and his wife complains about.  She has also reported episodes of no breathing.  He does wake up sometimes with morning headaches, and does endorse daytime fatigue/drowsiness.  Has not had a sleep study done in the past.  Rest of review of symptoms was negative.      07/25/2024    8:54 AM 05/22/2023   11:33 AM 04/25/2023    3:03 PM 06/23/2022   11:27 AM 11/17/2021   11:13 AM  Depression screen PHQ 2/9  Decreased Interest 0 1 1 2  0  Down, Depressed, Hopeless 0 0 0 0 0  PHQ - 2 Score 0 1 1 2  0  Altered sleeping 0 1 0 1 0  Tired, decreased energy 1 1 3 3 2   Change in appetite 0 0 0 0 0  Feeling bad or failure about yourself  0 0 0 0 0  Trouble concentrating 1 1 1  0 1  Moving slowly or fidgety/restless 1 0 0 0 0  Suicidal thoughts 0 0 0 0 0  PHQ-9 Score 3 4 5 6 3   Difficult doing work/chores Somewhat difficult  Not difficult at all Somewhat difficult Not difficult at  all     OBJECTIVE:   BP 125/78   Pulse 76   Ht 5' 9 (1.753 m)   Wt 243 lb (110.2 kg)   SpO2 99%   BMI 35.88 kg/m    Head: Normocephalic, no asymmetry or ecchymosis  Eyes: PERRLA, EOM intact and normal  Nose: Patent bilaterally, mildly erythematous, no deviation seen Throat: Mallampati 4, no ulcerations or lesions, poor dentition, oropharynx not visualized NECK: Mildly fibrous, large neck circumference, full ROM, no LAD, non-tender, thyroid normal in size with no nodules palpated Cardiac: Regular rate and rhythm. Normal S1/S2. No murmurs, rubs, or gallops appreciated. Lungs: Clear bilaterally to ascultation.  Abdomen: Normoactive bowel sounds. No tenderness to deep or light palpation. No rebound or guarding.   Left leg: Marked edema of left ankle and 3+ pitting edema to mid tibia, exquisitely tender medial malleolus, nontender lateral malleolus, some warmness, no visible erythema.  Negative Sebastian, negative Homans' sign.  Right leg not edematous. MSK, left ankle minimal flexion and extension, no supination or pronation, nonpainful to move.  Right ankle: Full range of motion Derm: Scales and minor abrasions on feet bilaterally, onychomycosis bilaterally. Psych: Pleasant and appropriate    ASSESSMENT/PLAN:   Assessment &  Plan Routine adult health maintenance Well-appearing adult making good progress on diabetes but in need of lifestyle changes.  Ordered colonoscopy, and ophthalmology referral.  Screening labs ordered, as well as routine blood work.  Administered Tdap and Prevnar today Type 2 diabetes mellitus with stage 3b chronic kidney disease, without long-term current use of insulin  (HCC) Controlled, A1c today 7.4 and pending ACR.  History of poorly controlled diabetes with insulin , doing better with dulaglutide .  Told not to do metformin  by nephrologist because of CKD.  Poor diet and exercise. -Diet and exercise counseling given will try this for next 3 months to see if we  can get A1c under 7. - Continue dulaglutide  3 mg once a week - Refill diabetic supplies - Record fasting glucose and Premeal glucose Pure hypercholesterolemia No recent lipid panel, was not taking atorvastatin .  Extensive counseling given regarding medication adherence. -Refilled atorvastatin  40 mg once a day -Lipid panel ordered Acute idiopathic gout of left ankle Acute onset, this is a primary diagnosis given physical exam findings, though history could not identify any trigger but has been on HCTZ for years.  Other concerns include osteomyelitis given diabetes and acute onset with tenderness, some scales and minor lesions on foot could have been nidus for infection.  Ankle fracture also possibility, but no reported trauma.  Because of edema up left leg, could not rule out DVT but negative Sebastian and Toula' sign and history of worse left leg swelling than right are reassuring. - Stat vascular DVT ultrasound -Left ankle x-ray ordered -Start prednisone  40 mg for 5 days -Serum uric acid ordered Habitual snoring High suspicion for obstructive sleep apnea, given physical exam findings.  Will further assess if sleep study negative. -Split-night sleep center referral placed Annual Examination  See AVS for age appropriate recommendations.  PHQ score 3, reviewed and discussed.  Blood pressure value is at goal, discussed.   Considered the following screening exams based upon USPSTF recommendations: Diabetes screening: discussed Screening for elevated cholesterol: ordered HIV testing: ordered Hepatitis C: ordered Hepatitis B: ordered Syphilis if at high risk: discussed Reviewed risk factors for latent tuberculosis and not discussed Colorectal cancer screening: discussed, colonoscopy ordered Lung cancer screening: Not indicated See documentation below regarding discussion and indication.  PSA discussed and after engaging in discussion of possible risks, benefits and complications of  screening patient elected to get PSA ordered, declined DRE.   Follow up in 1 year or sooner if indicated.  MyChart Activation: Signed up today   Fairy Amy, MD [facility]

## 2024-07-25 NOTE — Addendum Note (Signed)
 Addended by: DONZETTA QUANT E on: 07/25/2024 12:08 PM   Modules accepted: Level of Service

## 2024-07-25 NOTE — Patient Instructions (Addendum)
 Thank you for visiting the clinic today, it was good to see you!  Please always bring your medication bottles  In today's visit we discussed:  Diabetes: Great job on controlling your diabetes, continue making diet and exercise changes. Try eating out every other week and doing 30-60 minutes of exercise twice a week at the park or the track. I have put in a prescription for a refill of diabetes supplies, take your blood glucose in the morning and before meals.  Cholesterol: Start taking the atorvastatin  once a day.  Gout: We will need to change your blood pressure medications which should reduce your risk of gout. I have attached a handout. Take the Prednisone  2 pills once a morning for 5 days.  Health maintenance: I have put a referral for colonoscopy and ophthalmology, they should contact you within the next two weeks. Today you got the TDAP and Prevnar vaccines. We have ordered lots of labs, I will follow up with those as they result.  Snoring: The sleep center should also call you in the next two weeks to assess for sleep apnea.  Please follow-up in 2-4 weeks  For any questions, please call the office at [phone] or send me a message in MyChart. Have a great day!  -Fairy Amy, MD  [facility] Family Medicine Resident, PGY-1

## 2024-07-25 NOTE — Assessment & Plan Note (Signed)
 No recent lipid panel, was not taking atorvastatin .  Extensive counseling given regarding medication adherence. -Refilled atorvastatin  40 mg once a day -Lipid panel ordered

## 2024-07-26 ENCOUNTER — Ambulatory Visit (HOSPITAL_COMMUNITY)
Admission: RE | Admit: 2024-07-26 | Discharge: 2024-07-26 | Disposition: A | Discharge status: Home / Self Care | Source: Ambulatory Visit | Attending: Family Medicine | Admitting: Family Medicine

## 2024-07-26 ENCOUNTER — Telehealth: Payer: Self-pay

## 2024-07-26 DIAGNOSIS — M7989 Other specified soft tissue disorders: Secondary | ICD-10-CM | POA: Diagnosis present

## 2024-07-26 LAB — COMPREHENSIVE METABOLIC PANEL WITH GFR
ALT: 17 IU/L (ref 0–44)
AST: 16 IU/L (ref 0–40)
Albumin: 4.2 g/dL (ref 3.8–4.9)
Alkaline Phosphatase: 83 IU/L (ref 44–121)
BUN/Creatinine Ratio: 14 (ref 9–20)
BUN: 57 mg/dL — ABNORMAL HIGH (ref 6–24)
Bilirubin Total: 0.2 mg/dL (ref 0.0–1.2)
CO2: 20 mmol/L (ref 20–29)
Calcium: 9.3 mg/dL (ref 8.7–10.2)
Chloride: 104 mmol/L (ref 96–106)
Creatinine, Ser: 3.99 mg/dL — ABNORMAL HIGH (ref 0.76–1.27)
Globulin, Total: 3 g/dL (ref 1.5–4.5)
Glucose: 112 mg/dL — ABNORMAL HIGH (ref 70–99)
Potassium: 4.5 mmol/L (ref 3.5–5.2)
Sodium: 142 mmol/L (ref 134–144)
Total Protein: 7.2 g/dL (ref 6.0–8.5)
eGFR: 17 mL/min/1.73 — ABNORMAL LOW (ref >59)

## 2024-07-26 LAB — LIPID PANEL
Chol/HDL Ratio: 4.7 ratio (ref 0.0–5.0)
Cholesterol, Total: 147 mg/dL (ref 100–199)
HDL: 31 mg/dL — ABNORMAL LOW (ref >39)
LDL Chol Calc (NIH): 94 mg/dL (ref 0–99)
Triglycerides: 123 mg/dL (ref 0–149)
VLDL Cholesterol Cal: 22 mg/dL (ref 5–40)

## 2024-07-26 LAB — CBC WITH DIFFERENTIAL/PLATELET
Basophils Absolute: 0 x10E3/uL (ref 0.0–0.2)
Basos: 1 %
EOS (ABSOLUTE): 0.6 x10E3/uL — ABNORMAL HIGH (ref 0.0–0.4)
Eos: 7 %
Hematocrit: 38.3 % (ref 37.5–51.0)
Hemoglobin: 12.3 g/dL — ABNORMAL LOW (ref 13.0–17.7)
Immature Grans (Abs): 0 x10E3/uL (ref 0.0–0.1)
Immature Granulocytes: 0 %
Lymphocytes Absolute: 2.4 x10E3/uL (ref 0.7–3.1)
Lymphs: 30 %
MCH: 29.8 pg (ref 26.6–33.0)
MCHC: 32.1 g/dL (ref 31.5–35.7)
MCV: 93 fL (ref 79–97)
Monocytes Absolute: 0.5 x10E3/uL (ref 0.1–0.9)
Monocytes: 6 %
Neutrophils Absolute: 4.6 x10E3/uL (ref 1.4–7.0)
Neutrophils: 56 %
Platelets: 264 x10E3/uL (ref 150–450)
RBC: 4.13 x10E6/uL — ABNORMAL LOW (ref 4.14–5.80)
RDW: 13.5 % (ref 11.6–15.4)
WBC: 8.1 x10E3/uL (ref 3.4–10.8)

## 2024-07-26 LAB — HIV ANTIBODY (ROUTINE TESTING W REFLEX): HIV Screen 4th Generation wRfx: NONREACTIVE

## 2024-07-26 LAB — URIC ACID: Uric Acid: 10.3 mg/dL — ABNORMAL HIGH (ref 3.8–8.4)

## 2024-07-26 LAB — TSH RFX ON ABNORMAL TO FREE T4: TSH: 0.99 u[IU]/mL (ref 0.450–4.500)

## 2024-07-26 LAB — HEPATITIS C ANTIBODY: Hep C Virus Ab: NONREACTIVE

## 2024-07-26 LAB — PSA: Prostate Specific Ag, Serum: 0.8 ng/mL (ref 0.0–4.0)

## 2024-07-26 LAB — HEPATITIS B SURFACE ANTIGEN: Hepatitis B Surface Ag: NEGATIVE

## 2024-07-26 NOTE — Telephone Encounter (Signed)
 [facility] calls nurse line to give call report.  She reports negative DVT.  Advised will forward to PCP.

## 2024-07-27 LAB — MICROALBUMIN / CREATININE URINE RATIO
Creatinine, Urine: 86.1 mg/dL
Microalb/Creat Ratio: 1113 mg/g{creat} — ABNORMAL HIGH (ref 0–29)
Microalbumin, Urine: 958.4 ug/mL

## 2024-08-02 ENCOUNTER — Other Ambulatory Visit: Payer: Self-pay

## 2024-08-02 NOTE — Progress Notes (Signed)
 Attempted to reach patient regarding note left by Provider. No answer. LVM for patient to call the office if he has made an appt with a nephrology clinic. Nelson Land, CMA

## 2024-08-13 ENCOUNTER — Telehealth: Payer: Self-pay

## 2024-08-13 MED ORDER — TRULICITY 3 MG/0.5ML ~~LOC~~ SOAJ: 3.0000 mg | SUBCUTANEOUS | 0 refills | Status: DC

## 2024-08-13 NOTE — Telephone Encounter (Signed)
 Called patient and LVM advising of update.   Chiquita JAYSON English, RN

## 2024-08-13 NOTE — Telephone Encounter (Signed)
 Prescription Trulicity  resent

## 2024-08-13 NOTE — Telephone Encounter (Signed)
 Patient calls nurse line regarding issues with picking up Trulicity  prescription.   Called pharmacy. Dr. Coralee is not enrolled in Medicaid. Forwarding to attending to resend prescription.   Thanks.   Chiquita JAYSON English, RN

## 2024-08-21 ENCOUNTER — Other Ambulatory Visit: Payer: Self-pay

## 2024-08-21 DIAGNOSIS — I10 Essential (primary) hypertension: Secondary | ICD-10-CM

## 2024-08-22 NOTE — Telephone Encounter (Signed)
 Chart reviewed  -Fairy Amy, MD

## 2024-10-10 ENCOUNTER — Encounter: Payer: Self-pay | Admitting: Internal Medicine

## 2024-10-16 ENCOUNTER — Encounter (HOSPITAL_BASED_OUTPATIENT_CLINIC_OR_DEPARTMENT_OTHER): Admitting: Internal Medicine

## 2024-11-09 ENCOUNTER — Other Ambulatory Visit: Payer: Self-pay | Admitting: Family Medicine

## 2024-11-09 ENCOUNTER — Other Ambulatory Visit: Payer: Self-pay

## 2024-11-09 DIAGNOSIS — I10 Essential (primary) hypertension: Secondary | ICD-10-CM

## 2024-11-11 NOTE — Telephone Encounter (Signed)
 Chart reviewed  -Fairy Amy, MD

## 2024-11-12 ENCOUNTER — Ambulatory Visit

## 2024-11-14 ENCOUNTER — Other Ambulatory Visit: Payer: Self-pay

## 2024-11-14 ENCOUNTER — Other Ambulatory Visit (HOSPITAL_COMMUNITY): Payer: Self-pay

## 2024-11-14 ENCOUNTER — Telehealth: Payer: Self-pay

## 2024-11-14 DIAGNOSIS — N1832 Chronic kidney disease, stage 3b: Secondary | ICD-10-CM

## 2024-11-14 DIAGNOSIS — E119 Type 2 diabetes mellitus without complications: Secondary | ICD-10-CM

## 2024-11-14 NOTE — Telephone Encounter (Signed)
 Last eGFR was 17, not good enough kidney function to resume medication.  Dr. Fairy Amy, MD [facility] Family Medicine Resident, PGY-1

## 2024-11-14 NOTE — Telephone Encounter (Signed)
 Prior authorization submitted for TRULICITY  3MG  to [facility] MEDICAID via Latent.   Key: [id number]

## 2024-11-14 NOTE — Progress Notes (Signed)
 Patient's eGFR on last BMP was 17, prior BMP and eGFR of 24.  No longer candidate for either empagliflozin or dapagliflozin  due to poor renal function. -DC'd dapagliflozin 

## 2024-11-15 NOTE — Telephone Encounter (Signed)
 Pharmacy Patient Advocate Encounter  Received notification from [facility] MEDICAID that Prior Authorization for TRULICITY  3MG  has been APPROVED from 11/14/24 to 11/14/25   PA #/Case ID/Reference #: [id number]
